# Patient Record
Sex: Female | Born: 1955 | Hispanic: No | Marital: Married | Smoking: Never smoker
Health system: Southern US, Community
[De-identification: ages and names within clinical notes are randomized; demographics above are authoritative.]

## PROBLEM LIST (undated history)

## (undated) DIAGNOSIS — G4733 Obstructive sleep apnea (adult) (pediatric): Secondary | ICD-10-CM

## (undated) DIAGNOSIS — E785 Hyperlipidemia, unspecified: Secondary | ICD-10-CM

## (undated) DIAGNOSIS — G629 Polyneuropathy, unspecified: Secondary | ICD-10-CM

## (undated) DIAGNOSIS — R42 Dizziness and giddiness: Secondary | ICD-10-CM

## (undated) DIAGNOSIS — G4700 Insomnia, unspecified: Secondary | ICD-10-CM

## (undated) DIAGNOSIS — F419 Anxiety disorder, unspecified: Secondary | ICD-10-CM

## (undated) DIAGNOSIS — Z8639 Personal history of other endocrine, nutritional and metabolic disease: Secondary | ICD-10-CM

## (undated) HISTORY — DX: Hyperlipidemia, unspecified: E78.5

## (undated) HISTORY — PX: APPENDECTOMY: SHX54

## (undated) HISTORY — DX: Obstructive sleep apnea (adult) (pediatric): G47.33

## (undated) HISTORY — DX: Dizziness and giddiness: R42

## (undated) HISTORY — DX: Insomnia, unspecified: G47.00

## (undated) HISTORY — DX: Personal history of other endocrine, nutritional and metabolic disease: Z86.39

## (undated) HISTORY — DX: Polyneuropathy, unspecified: G62.9

## (undated) HISTORY — DX: Anxiety disorder, unspecified: F41.9

---

## 1998-02-25 ENCOUNTER — Ambulatory Visit (HOSPITAL_COMMUNITY): Admission: RE | Admit: 1998-02-25 | Discharge: 1998-02-25 | Payer: Self-pay | Admitting: Neurology

## 1998-07-10 ENCOUNTER — Other Ambulatory Visit: Admission: RE | Admit: 1998-07-10 | Discharge: 1998-07-10 | Payer: Self-pay | Admitting: Gynecology

## 1999-09-09 ENCOUNTER — Encounter: Payer: Self-pay | Admitting: Family Medicine

## 1999-09-09 ENCOUNTER — Ambulatory Visit (HOSPITAL_COMMUNITY): Admission: RE | Admit: 1999-09-09 | Discharge: 1999-09-09 | Payer: Self-pay | Admitting: Family Medicine

## 1999-09-19 ENCOUNTER — Ambulatory Visit (HOSPITAL_COMMUNITY): Admission: RE | Admit: 1999-09-19 | Discharge: 1999-09-19 | Payer: Self-pay | Admitting: Family Medicine

## 1999-09-19 ENCOUNTER — Encounter: Payer: Self-pay | Admitting: Family Medicine

## 1999-09-24 ENCOUNTER — Other Ambulatory Visit: Admission: RE | Admit: 1999-09-24 | Discharge: 1999-09-24 | Payer: Self-pay | Admitting: Gynecology

## 1999-09-25 ENCOUNTER — Ambulatory Visit (HOSPITAL_COMMUNITY): Admission: RE | Admit: 1999-09-25 | Discharge: 1999-09-25 | Payer: Self-pay | Admitting: Gynecology

## 1999-09-25 ENCOUNTER — Encounter (INDEPENDENT_AMBULATORY_CARE_PROVIDER_SITE_OTHER): Payer: Self-pay | Admitting: Specialist

## 1999-10-28 ENCOUNTER — Encounter: Admission: RE | Admit: 1999-10-28 | Discharge: 1999-12-05 | Payer: Self-pay

## 2000-02-09 ENCOUNTER — Encounter: Payer: Self-pay | Admitting: Family Medicine

## 2000-02-09 ENCOUNTER — Encounter: Admission: RE | Admit: 2000-02-09 | Discharge: 2000-02-09 | Payer: Self-pay | Admitting: Family Medicine

## 2000-10-12 ENCOUNTER — Other Ambulatory Visit: Admission: RE | Admit: 2000-10-12 | Discharge: 2000-10-12 | Payer: Self-pay | Admitting: Gynecology

## 2001-02-10 ENCOUNTER — Encounter: Admission: RE | Admit: 2001-02-10 | Discharge: 2001-02-10 | Payer: Self-pay | Admitting: Gynecology

## 2001-02-10 ENCOUNTER — Encounter: Payer: Self-pay | Admitting: Gynecology

## 2002-02-13 ENCOUNTER — Encounter: Payer: Self-pay | Admitting: Gynecology

## 2002-02-13 ENCOUNTER — Encounter: Admission: RE | Admit: 2002-02-13 | Discharge: 2002-02-13 | Payer: Self-pay | Admitting: Gynecology

## 2002-07-31 ENCOUNTER — Other Ambulatory Visit: Admission: RE | Admit: 2002-07-31 | Discharge: 2002-07-31 | Payer: Self-pay | Admitting: Gynecology

## 2003-04-09 ENCOUNTER — Encounter: Payer: Self-pay | Admitting: Gynecology

## 2003-04-09 ENCOUNTER — Encounter: Admission: RE | Admit: 2003-04-09 | Discharge: 2003-04-09 | Payer: Self-pay | Admitting: Gynecology

## 2004-01-14 ENCOUNTER — Other Ambulatory Visit: Admission: RE | Admit: 2004-01-14 | Discharge: 2004-01-14 | Payer: Self-pay | Admitting: Gynecology

## 2004-06-06 ENCOUNTER — Encounter: Admission: RE | Admit: 2004-06-06 | Discharge: 2004-06-06 | Payer: Self-pay | Admitting: Gynecology

## 2005-02-04 ENCOUNTER — Encounter: Admission: RE | Admit: 2005-02-04 | Discharge: 2005-02-04 | Payer: Self-pay | Admitting: Gynecology

## 2005-02-10 ENCOUNTER — Other Ambulatory Visit: Admission: RE | Admit: 2005-02-10 | Discharge: 2005-02-10 | Payer: Self-pay | Admitting: Gynecology

## 2005-06-08 ENCOUNTER — Ambulatory Visit (HOSPITAL_BASED_OUTPATIENT_CLINIC_OR_DEPARTMENT_OTHER): Admission: RE | Admit: 2005-06-08 | Discharge: 2005-06-08 | Payer: Self-pay | Admitting: Gynecology

## 2005-06-08 ENCOUNTER — Encounter (INDEPENDENT_AMBULATORY_CARE_PROVIDER_SITE_OTHER): Payer: Self-pay | Admitting: Specialist

## 2005-06-08 ENCOUNTER — Ambulatory Visit (HOSPITAL_COMMUNITY): Admission: RE | Admit: 2005-06-08 | Discharge: 2005-06-08 | Payer: Self-pay | Admitting: Gynecology

## 2005-08-12 ENCOUNTER — Encounter: Admission: RE | Admit: 2005-08-12 | Discharge: 2005-08-12 | Payer: Self-pay | Admitting: Gynecology

## 2006-02-24 ENCOUNTER — Other Ambulatory Visit: Admission: RE | Admit: 2006-02-24 | Discharge: 2006-02-24 | Payer: Self-pay | Admitting: Gynecology

## 2006-10-12 ENCOUNTER — Encounter: Admission: RE | Admit: 2006-10-12 | Discharge: 2006-10-12 | Payer: Self-pay | Admitting: Gynecology

## 2008-02-06 ENCOUNTER — Encounter: Admission: RE | Admit: 2008-02-06 | Discharge: 2008-02-06 | Payer: Self-pay | Admitting: Gynecology

## 2009-02-06 ENCOUNTER — Encounter: Admission: RE | Admit: 2009-02-06 | Discharge: 2009-02-06 | Payer: Self-pay | Admitting: Family Medicine

## 2009-02-18 ENCOUNTER — Encounter: Admission: RE | Admit: 2009-02-18 | Discharge: 2009-02-18 | Payer: Self-pay | Admitting: Family Medicine

## 2009-02-20 ENCOUNTER — Encounter: Payer: Self-pay | Admitting: Emergency Medicine

## 2009-02-20 ENCOUNTER — Inpatient Hospital Stay (HOSPITAL_COMMUNITY): Admission: EM | Admit: 2009-02-20 | Discharge: 2009-02-20 | Payer: Self-pay | Admitting: General Surgery

## 2009-02-20 ENCOUNTER — Ambulatory Visit: Payer: Self-pay | Admitting: Diagnostic Radiology

## 2009-02-20 ENCOUNTER — Encounter (INDEPENDENT_AMBULATORY_CARE_PROVIDER_SITE_OTHER): Payer: Self-pay | Admitting: General Surgery

## 2010-02-07 ENCOUNTER — Encounter: Admission: RE | Admit: 2010-02-07 | Discharge: 2010-02-07 | Payer: Self-pay | Admitting: Gynecology

## 2010-10-25 LAB — DIFFERENTIAL
Eosinophils Relative: 0 % (ref 0–5)
Lymphocytes Relative: 11 % — ABNORMAL LOW (ref 12–46)
Lymphs Abs: 1.5 10*3/uL (ref 0.7–4.0)
Monocytes Absolute: 0.5 10*3/uL (ref 0.1–1.0)
Monocytes Relative: 4 % (ref 3–12)

## 2010-10-25 LAB — URINE CULTURE: Colony Count: 9000

## 2010-10-25 LAB — PREGNANCY, URINE: Preg Test, Ur: NEGATIVE

## 2010-10-25 LAB — URINALYSIS, ROUTINE W REFLEX MICROSCOPIC
Ketones, ur: >80 mg/dL — AB
Nitrite: NEGATIVE
Protein, ur: NEGATIVE mg/dL
pH: 6 (ref 5.0–8.0)

## 2010-10-25 LAB — CBC
MCHC: 35 g/dL (ref 30.0–36.0)
MCV: 94.1 fL (ref 78.0–100.0)
Platelets: 235 10*3/uL (ref 150–400)

## 2010-10-25 LAB — COMPREHENSIVE METABOLIC PANEL
ALT: 7 U/L (ref 0–35)
AST: 18 U/L (ref 0–37)
Albumin: 4.1 g/dL (ref 3.5–5.2)
Calcium: 9.2 mg/dL (ref 8.4–10.5)
Creatinine, Ser: 0.7 mg/dL (ref 0.4–1.2)
GFR calc Af Amer: >60 mL/min (ref >60)
Sodium: 142 mEq/L (ref 135–145)
Total Protein: 7.8 g/dL (ref 6.0–8.3)

## 2010-10-25 LAB — LIPASE, BLOOD: Lipase: 73 U/L (ref 23–300)

## 2010-10-25 LAB — URINE MICROSCOPIC-ADD ON

## 2010-12-02 NOTE — Op Note (Signed)
NAMEVONCEIL, UPSHUR NO.:  [id number]   MEDICAL RECORD NO.:  [id number]          PATIENT TYPE:  INP   LOCATION:  5014                         FACILITY:  [facility]   PHYSICIAN:  Gabrielle Dare. Janee Morn, M.D.DATE OF BIRTH:  [birth year]   DATE OF PROCEDURE:  02/20/2009  DATE OF DISCHARGE:                               OPERATIVE REPORT   PREOPERATIVE DIAGNOSIS:  Acute appendicitis.   POSTOPERATIVE DIAGNOSIS:  Acute appendicitis.   PROCEDURE:  Laparoscopic appendectomy.   SURGEON:  Gabrielle Dare. Janee Morn, MD   ANESTHESIA:  General endotracheal.   HISTORY OF PRESENT ILLNESS:  Ms. Hack is a 55 year old white female  who presented to the medical center of [facility] with a 2-  day history of abdominal pain that localized to her right lower  quadrant.  CT scan demonstrated an enlarged appendix consistent with  appendicitis.  She was accepted and transferred to [facility]  and brought emergently to the operating room for laparoscopic  appendectomy.   PROCEDURE IN DETAIL:  Informed consent was obtained.  The patient was  identified in the preop holding area.  She had received intravenous  antibiotics.  She was brought to the operating room and general  endotracheal anesthesia was administered by the anesthesia staff.  Her  abdomen was prepped and draped in the sterile fashion.  Time-out  procedure was done.  Infraumbilical area was infiltrated with 0.25%  Marcaine with epinephrine.  Infraumbilical incision was made.  Subcutaneous tissues were dissected down revealing the anterior fascia.  This was divided sharply and the peritoneal cavity was entered under  direct vision without difficulty.  A 0 Vicryl pursestring suture was  placed around the fascial opening.  The Hasson trocar was inserted into  the abdomen.  The abdomen was insufflated with carbon dioxide in  standard fashion.  Under direct vision, a 12-mm left lower quadrant and  a 5-mm right midabdomen  port were placed.  Marcaine 0.25% with  epinephrine was used at all port sites.  Laparoscopic exploration  revealed a very enlarged and inflamed appendix.  It was not perforated.  The mesoappendix was gradually divided with the harmonic scalpel,  achieving excellent hemostasis.  This revealed the base of the appendix  was quite wide, so I selected the reticulating laparoscopic GIA stapler  with a vascular load.  The appendix was divided at its base.  There was  excellent staple line that was intact.  The appendix was placed in an  EndoCatch bag and removed from the abdomen via the left lower quadrant  port site.  The abdomen was copiously irrigated with over 2 L of saline.  There was 1 point along the staple line that was bleeding.  This was  just touched with a cautery, achieving excellent hemostasis.  Further  irrigation revealed no further bleeding.  The irrigation fluid was  clear.  Staple line remained intact.  The remainder of the irrigation  fluid was evacuated.  The ports were then removed under direct vision.  The pneumoperitoneum was released.  The Hasson trocar was removed.  The  infraumbilical fascia was closed by tying the  0 Vicryl pursestring  suture with care not to trap any intra-abdominal contents.  All 3 wounds  were copiously irrigated.  The skin of each was closed with running 4-0  Vicryl subcuticular stitch followed by Dermabond.  All sponge, needle,  and instrument counts were correct.  The patient tolerated the procedure  well without apparent complication and was taken to recovery room in  stable condition.      Gabrielle Dare Janee Morn, M.D.  Electronically Signed     BET/MEDQ  D:  02/20/2009  T:  02/20/2009  Job:  [id number]   cc:   Molly Maduro L. Foy [place], M.D.

## 2010-12-02 NOTE — H&P (Signed)
Annette Moody, SENDEJO NO.:  [id number]   MEDICAL RECORD NO.:  [id number]          PATIENT TYPE:  INP   LOCATION:  5014                         FACILITY:  [facility]   PHYSICIAN:  Gabrielle Dare. Janee Morn, M.D.DATE OF BIRTH:  10-04-1955   DATE OF ADMISSION:  02/20/2009  DATE OF DISCHARGE:                              HISTORY & PHYSICAL   CHIEF COMPLAINT:  Right lower quadrant abdominal pain.   HISTORY OF PRESENT ILLNESS:  Annette Moody is a 55 year old white female  who is otherwise healthy, who developed generalized abdominal pain 2  days ago.  She had associated nausea and vomiting especially in the  evening after the pain developed.  The patient was seen by her primary  physician and thought to have food poisoning.  She was prescribed  Phenergan, the nausea improved somewhat, but the pain persisted.  It  gradually localized to her right lower quadrant.  The pain worsened and  she went to the [facility] early this morning  for evaluation.  There, she was found to have elevated white blood cell  count of 13,000 and CT scan of the abdomen and pelvis was done showing  acute appendicitis.  She was accepted and transferred to [facility].   PAST MEDICAL HISTORY:  Negative.   PAST SURGICAL HISTORY:  Tubal ligation.   CURRENT MEDICATIONS:  Phenergan p.r.n.   ALLERGIES:  No known drug allergies.   REVIEW OF SYSTEMS:  Positive for the GI complaints as above, otherwise  negative.   PHYSICAL EXAMINATION:  VITAL SIGNS:  Temperature 98.3, blood pressure  114/68, heart rate 70, respirations 16.  GENERAL:  She is awake and alert.  HEENT:  Pupils are equal.  Sclerae clear.  Oral mucosa is moist.  NECK:  Supple with no tenderness or masses.  LUNGS:  Clear to auscultation.  CARDIOVASCULAR:  Regular.  No murmurs.  Pulses palpable in the left  chest.  ABDOMEN:  Tender in the right lower quadrant with no guarding.  No  masses are felt.  Bowel sounds  are present.  SKIN:  Warm and dry.  EXTREMITIES:  No deformities.   LABORATORY STUDIES:  Show sodium 142, potassium 3.8, chloride 103, CO2  of 27, BUN 15, creatinine 0.7, glucose 121.  White blood cell count  13,000, hemoglobin 14, platelets 235.  Chest x-ray negative.  EKG:  Normal sinus rhythm.   IMPRESSION AND PLAN:  Acute appendicitis.  Plan would be to take the  patient to the operating room for emergency laparoscopic appendectomy.  She has received intravenous antibiotics.  Procedure, risks, and  benefits are discussed in detail with the patient.  She agrees.      Gabrielle Dare Janee Morn, M.D.  Electronically Signed     BET/MEDQ  D:  02/20/2009  T:  02/20/2009  Job:  [id number]

## 2010-12-05 NOTE — Op Note (Signed)
NAME:  Annette Moody, Annette Moody NO.:  [id number]   MEDICAL RECORD NO.:  [id number]          PATIENT TYPE:  AMB   LOCATION:  NESC                         FACILITY:  [facility]   PHYSICIAN:  Gretta Cool, M.D. DATE OF BIRTH:  [birth year]   DATE OF PROCEDURE:  06/08/2005  DATE OF DISCHARGE:                                 OPERATIVE REPORT   PREOPERATIVE DIAGNOSIS:  Abnormal uterine bleeding, unresponsive to  conservative therapy.   POSTOPERATIVE DIAGNOSIS:  Abnormal uterine bleeding, unresponsive to  conservative therapy.   PROCEDURE:  Hysteroscopy, resection of polypoid posterior uterine wall and  total endometrial resection for ablation.   SURGEON:  Gretta Cool, M.D.   ANESTHESIA:  MAC and paracervical block.   DESCRIPTION OF PROCEDURE:  Under excellent anesthesia as above, with the  patient prepped and draped in lithotomy position and in Clayton stirrups, and  her bladder drained, a weighted speculum was placed in the vagina.  The  cervix was then progressively dilated with a series of Pratt dilators to  accommodate the 7 mm resectoscope.  Once the resectoscope was introduced the  endometrial cavity was photographed.  The upper endometrium and tubal ostial  area looked entirely normal.  There was a polypoid character to the  posterior uterine wall that was exceedingly thickened.  The anterior uterine  wall appeared normal.  Once the tubal ostial areas were resected so as to  close the tubal ostium and prevent excessive fluid leak, the entire  endometrial cavity was resected beginning with the posterior uterine wall.  It was resected down through the myometrium, endometrium into the myometrial  tissue, approximately 3 to 5 mm.  At this point once all of the endometrial  tissue had been resected a VaporTrode electrode was applied and the entire  endometrial cavity treated by VaporTrode so as to eliminate any islands of  endometrial tissue not resected in the  superficial myometrium.  The  procedure was then terminated without complications.  There was no  significant bleeding at reduced pressure. The patient returned to the  recovery room in excellent condition.           ______________________________  Gretta Cool, M.D.     CWL/MEDQ  D:  06/08/2005  T:  06/08/2005  Job:  [id number]   cc:   Molly Maduro L. Foy [place], M.D.  Fax: [phone]

## 2011-02-09 ENCOUNTER — Other Ambulatory Visit: Payer: Self-pay | Admitting: Gynecology

## 2011-02-09 DIAGNOSIS — Z1231 Encounter for screening mammogram for malignant neoplasm of breast: Secondary | ICD-10-CM

## 2011-02-16 ENCOUNTER — Ambulatory Visit
Admission: RE | Admit: 2011-02-16 | Discharge: 2011-02-16 | Disposition: A | Discharge status: Home / Self Care | Payer: BC Managed Care – PPO | Source: Ambulatory Visit | Attending: Gynecology | Admitting: Gynecology

## 2011-02-16 DIAGNOSIS — Z1231 Encounter for screening mammogram for malignant neoplasm of breast: Secondary | ICD-10-CM

## 2011-07-22 ENCOUNTER — Other Ambulatory Visit: Payer: Self-pay | Admitting: Gynecology

## 2012-06-03 ENCOUNTER — Other Ambulatory Visit: Payer: Self-pay | Admitting: Gynecology

## 2012-06-03 DIAGNOSIS — Z1231 Encounter for screening mammogram for malignant neoplasm of breast: Secondary | ICD-10-CM

## 2012-07-04 ENCOUNTER — Ambulatory Visit: Payer: BC Managed Care – PPO

## 2012-08-11 ENCOUNTER — Ambulatory Visit: Payer: BC Managed Care – PPO

## 2012-08-12 ENCOUNTER — Ambulatory Visit
Admission: RE | Admit: 2012-08-12 | Discharge: 2012-08-12 | Disposition: A | Discharge status: Home / Self Care | Payer: BC Managed Care – PPO | Source: Ambulatory Visit | Attending: Gynecology | Admitting: Gynecology

## 2012-08-12 DIAGNOSIS — Z1231 Encounter for screening mammogram for malignant neoplasm of breast: Secondary | ICD-10-CM

## 2012-10-12 ENCOUNTER — Emergency Department (HOSPITAL_BASED_OUTPATIENT_CLINIC_OR_DEPARTMENT_OTHER): Payer: BC Managed Care – PPO

## 2012-10-12 ENCOUNTER — Emergency Department (HOSPITAL_BASED_OUTPATIENT_CLINIC_OR_DEPARTMENT_OTHER)
Admission: EM | Admit: 2012-10-12 | Discharge: 2012-10-12 | Disposition: A | Discharge status: Home / Self Care | Payer: BC Managed Care – PPO | Attending: Emergency Medicine | Admitting: Emergency Medicine

## 2012-10-12 ENCOUNTER — Encounter (HOSPITAL_BASED_OUTPATIENT_CLINIC_OR_DEPARTMENT_OTHER): Payer: Self-pay | Admitting: *Deleted

## 2012-10-12 DIAGNOSIS — Z79899 Other long term (current) drug therapy: Secondary | ICD-10-CM | POA: Insufficient documentation

## 2012-10-12 DIAGNOSIS — R209 Unspecified disturbances of skin sensation: Secondary | ICD-10-CM | POA: Insufficient documentation

## 2012-10-12 DIAGNOSIS — R51 Headache: Secondary | ICD-10-CM

## 2012-10-12 DIAGNOSIS — R202 Paresthesia of skin: Secondary | ICD-10-CM

## 2012-10-12 LAB — BASIC METABOLIC PANEL
BUN: 13 mg/dL (ref 6–23)
Chloride: 104 mEq/L (ref 96–112)
Creatinine, Ser: 0.6 mg/dL (ref 0.50–1.10)
GFR calc Af Amer: >90 mL/min (ref >90)

## 2012-10-12 LAB — CBC WITH DIFFERENTIAL/PLATELET
Basophils Relative: 0 % (ref 0–1)
Eosinophils Relative: 1 % (ref 0–5)
HCT: 42.9 % (ref 36.0–46.0)
Hemoglobin: 14.6 g/dL (ref 12.0–15.0)
MCH: 31.5 pg (ref 26.0–34.0)
MCHC: 34 g/dL (ref 30.0–36.0)
MCV: 92.5 fL (ref 78.0–100.0)
Monocytes Absolute: 0.4 10*3/uL (ref 0.1–1.0)
Monocytes Relative: 5 % (ref 3–12)
Neutro Abs: 4.5 10*3/uL (ref 1.7–7.7)

## 2012-10-12 MED ORDER — METOCLOPRAMIDE HCL 5 MG/ML IJ SOLN
10.0000 mg | Freq: Once | INTRAMUSCULAR | Status: AC
  Administered 2012-10-12: 10 mg via INTRAVENOUS
  Filled 2012-10-12: qty 2

## 2012-10-12 MED ORDER — KETOROLAC TROMETHAMINE 30 MG/ML IJ SOLN
30.0000 mg | Freq: Once | INTRAMUSCULAR | Status: AC
  Administered 2012-10-12: 30 mg via INTRAVENOUS
  Filled 2012-10-12: qty 1

## 2012-10-12 MED ORDER — DIPHENHYDRAMINE HCL 50 MG/ML IJ SOLN
25.0000 mg | Freq: Once | INTRAMUSCULAR | Status: AC
  Administered 2012-10-12: 14:00:00 via INTRAVENOUS
  Filled 2012-10-12: qty 1

## 2012-10-12 NOTE — ED Provider Notes (Addendum)
57 year old female had a vague left sided headache last night. Today, she continued to have a left-sided headache with associated numbness which involve her entire face and left side of her body. The numbness was mild but she may not describe a sense of "feeling weird" on the left side of her body. There is no motor dysfunction in her speech was normal and she had no difficulty walking. She did have her blood pressure checked as he says it was very high for her. She saw her PCP who sent her to the ED for further evaluation. She states that the headache and the numbness or improving and are almost gone. She still feels a little funny in left side of her body. On exam, her vital signs are normal including normal blood pressure. Fundi show no hemorrhage, exudate, papilledema. There no carotid bruits. There is no tenderness palpation over the temporal artery. Lungs are clear heart is regular rate and rhythm without murmur. There is normal motor strength of 5/5 in arms and legs. There is no pronator drift. There no sensory deficits and no extinction on double simultaneous stimulation. I do suspect that her symptoms might be from a migraine variant and she is given an empiric trial of a migraine cocktail and she is being sent for CT. She probably should have remainder of TIA-type workup as an outpatient although this is not clearly a TIA.   Date: 10/12/2012  Rate: 80  Rhythm: normal sinus rhythm  QRS Axis: normal  Intervals: normal  ST/T Wave abnormalities: nonspecific T wave changes  Conduction Disutrbances:none  Narrative Interpretation: Minor nonspecific T wave flattening.  when compared with ECG of 02/20/2009, no significant changes are seen.  Old EKG Reviewed: unchanged  Medical screening examination/treatment/procedure(s) were conducted as a shared visit with non-physician practitioner(s) and myself.  I personally evaluated the patient during the encounter   Dione Booze, MD 10/12/12 1407  Dione Booze, MD 10/12/12 [phone]

## 2012-10-12 NOTE — ED Notes (Signed)
Pt reports she had headache this morning that started yesterday- around 0900 she reports "feeling strange"- tingling on left side of body, "couldn't focus, couldn't think straight", arms "felt heavy" and ears were ringing - b/p taken at work during episode was 138/110- went to pcp and was sent here for further eval and possible ct scan- pt states she feels better but still has left side headache

## 2012-10-12 NOTE — ED Notes (Signed)
Patient transported to X-ray 

## 2012-10-12 NOTE — ED Provider Notes (Signed)
History     CSN: [id number]  Arrival date & time 10/12/12  1306   First MD Initiated Contact with Patient 10/12/12 1321      Chief Complaint  Patient presents with  . Headache    (Consider location/radiation/quality/duration/timing/severity/associated sxs/prior treatment) HPI Comments: Pt states that she developed a headache yesterday that resolved through out the night:pt states that she went to work and she she developed the headache again and she developed a left sided tingling and didn't feel right:pt states that she is still having the symptoms but they have gotten some a lot better but have not resolved:pt denies cp, sob, fever, vomiting, slurred speech, numbness or weakness:pt states that she felt like her glasses were dirty in her left eye  Patient is a 57 y.o. female presenting with headaches. The history is provided by the patient. No language interpreter was used.  Headache Location: left sided. Quality:  Unable to specify Radiates to:  Does not radiate Onset quality:  Gradual Timing:  Intermittent Progression:  Improving Relieved by:  Nothing Ineffective treatments:  Acetaminophen and NSAIDs Associated symptoms: tingling   Associated symptoms: no fever, no numbness, no syncope, no vomiting and no weakness     History reviewed. No pertinent past medical history.  Past Surgical History  Procedure Laterality Date  . Appendectomy      No family history on file.  History  Substance Use Topics  . Smoking status: Never Smoker   . Smokeless tobacco: Never Used  . Alcohol Use: 0.6 oz/week    1 Glasses of wine per week    OB History   Grav Para Term Preterm Abortions TAB SAB Ect Mult Living                  Review of Systems  Constitutional: Negative for fever.  Respiratory: Negative.   Cardiovascular: Negative.  Negative for syncope.  Gastrointestinal: Negative for vomiting.  Neurological: Positive for headaches. Negative for numbness.    Allergies   Review of patient's allergies indicates no known allergies.  Home Medications   Current Outpatient Rx  Name  Route  Sig  Dispense  Refill  . atorvastatin (LIPITOR) 20 MG tablet   Oral   Take 20 mg by mouth daily.         Marland Kitchen CALCIUM PO   Oral   Take 1 tablet by mouth daily.         Marland Kitchen estradiol (VIVELLE-DOT) 0.0375 MG/24HR   Transdermal   Place 1 patch onto the skin 2 (two) times a week.         . Multiple Vitamin (MULTIVITAMIN) capsule   Oral   Take 1 capsule by mouth daily.           BP 120/75  Pulse 90  Temp(Src) 98.1 F (36.7 C) (Oral)  Resp 18  Ht 5\' 2"  (1.575 m)  Wt 179 lb (81.194 kg)  BMI 32.73 kg/m2  SpO2 98%  Physical Exam  Nursing note and vitals reviewed. Constitutional: She is oriented to person, place, and time. She appears well-developed and well-nourished.  HENT:  Head: Normocephalic and atraumatic.  Right Ear: External ear normal.  Left Ear: External ear normal.  Eyes: Conjunctivae and EOM are normal. Pupils are equal, round, and reactive to light.  Neck: Normal range of motion. Neck supple.  Cardiovascular: Normal rate and regular rhythm.   Pulmonary/Chest: Effort normal and breath sounds normal.  Abdominal: Soft. Bowel sounds are normal. There is no tenderness.  Musculoskeletal:  Normal range of motion.  Neurological: She is alert and oriented to person, place, and time. Coordination normal.  Skin: Skin is warm and dry.  Psychiatric: She has a normal mood and affect.    ED Course  Procedures (including critical care time)  Labs Reviewed  BASIC METABOLIC PANEL - Abnormal; Notable for the following:    Glucose, Bld 147 (*)    All other components within normal limits  CBC WITH DIFFERENTIAL  TROPONIN I   Dg Chest 2 View  10/12/2012  *RADIOLOGY REPORT*  Clinical Data: Headache, weakness, tingling  CHEST - 2 VIEW  Comparison: 02/20/2009  Findings: Cardiomediastinal silhouette is stable.  No acute infiltrate or pleural effusion.  No  pulmonary edema.  Bony thorax is stable.  IMPRESSION: No active disease.  No significant change.   Original Report Authenticated By: Natasha Mead, M.D.    Ct Head Wo Contrast  10/12/2012  *RADIOLOGY REPORT*  Clinical Data:  Left sided headache.  CT HEAD WITHOUT CONTRAST  Technique:  Contiguous axial images were obtained from the base of the skull through the vertex without contrast  Comparison:  None.  Findings:  The brain has a normal appearance without evidence for hemorrhage, acute infarction, hydrocephalus, or mass lesion.  There is no extra axial fluid collection.  The skull and paranasal sinuses are normal.  IMPRESSION: Normal CT of the head without contrast.   Original Report Authenticated By: Janeece Riggers, M.D.      1. Headache   2. Tingling of left face and arm       MDM  Discussed tia vs complex migraine with pt:pt is asymptomatic at this time:negative workup today:think that pt is okay to follow up with pcp for out pt work up for ZOX:WRUEA with Gillermina Hu FNP at [facility] and she is okay with plan        Teressa Lower, NP 10/12/12 1529

## 2012-10-12 NOTE — ED Notes (Signed)
Patient transported to CT 

## 2012-10-14 ENCOUNTER — Other Ambulatory Visit: Payer: Self-pay | Admitting: Family Medicine

## 2012-10-14 DIAGNOSIS — R51 Headache: Secondary | ICD-10-CM

## 2012-10-18 ENCOUNTER — Other Ambulatory Visit: Payer: BC Managed Care – PPO

## 2012-10-24 ENCOUNTER — Ambulatory Visit
Admission: RE | Admit: 2012-10-24 | Discharge: 2012-10-24 | Disposition: A | Discharge status: Home / Self Care | Payer: BC Managed Care – PPO | Source: Ambulatory Visit | Attending: Family Medicine | Admitting: Family Medicine

## 2012-10-24 DIAGNOSIS — R51 Headache: Secondary | ICD-10-CM

## 2013-10-26 ENCOUNTER — Other Ambulatory Visit: Payer: Self-pay | Admitting: Family Medicine

## 2013-10-26 ENCOUNTER — Other Ambulatory Visit: Payer: Self-pay | Admitting: *Deleted

## 2013-10-26 DIAGNOSIS — E041 Nontoxic single thyroid nodule: Secondary | ICD-10-CM

## 2013-10-26 DIAGNOSIS — R002 Palpitations: Secondary | ICD-10-CM

## 2013-10-31 ENCOUNTER — Encounter: Payer: Self-pay | Admitting: Radiology

## 2013-10-31 ENCOUNTER — Encounter (INDEPENDENT_AMBULATORY_CARE_PROVIDER_SITE_OTHER): Payer: Self-pay

## 2013-10-31 ENCOUNTER — Encounter (INDEPENDENT_AMBULATORY_CARE_PROVIDER_SITE_OTHER): Payer: BC Managed Care – PPO

## 2013-10-31 DIAGNOSIS — R002 Palpitations: Secondary | ICD-10-CM

## 2013-10-31 NOTE — Progress Notes (Signed)
Patient ID: Baldemar LenisKertrainer H Urista, female   DOB: [birth year], 58 y.o.   MRN: [id number] E cardio 48 hr holter applied

## 2013-11-02 ENCOUNTER — Ambulatory Visit
Admission: RE | Admit: 2013-11-02 | Discharge: 2013-11-02 | Disposition: A | Discharge status: Home / Self Care | Payer: BC Managed Care – PPO | Source: Ambulatory Visit | Attending: Family Medicine | Admitting: Family Medicine

## 2013-11-02 DIAGNOSIS — E041 Nontoxic single thyroid nodule: Secondary | ICD-10-CM

## 2014-02-16 ENCOUNTER — Other Ambulatory Visit: Payer: Self-pay | Admitting: Endocrinology

## 2014-02-16 DIAGNOSIS — E049 Nontoxic goiter, unspecified: Secondary | ICD-10-CM

## 2014-05-02 ENCOUNTER — Other Ambulatory Visit: Payer: BC Managed Care – PPO

## 2014-05-03 ENCOUNTER — Ambulatory Visit
Admission: RE | Admit: 2014-05-03 | Discharge: 2014-05-03 | Disposition: A | Discharge status: Home / Self Care | Payer: BC Managed Care – PPO | Source: Ambulatory Visit | Attending: Endocrinology | Admitting: Endocrinology

## 2014-05-03 DIAGNOSIS — E049 Nontoxic goiter, unspecified: Secondary | ICD-10-CM

## 2014-05-25 ENCOUNTER — Other Ambulatory Visit: Payer: Self-pay | Admitting: Endocrinology

## 2014-05-25 DIAGNOSIS — E049 Nontoxic goiter, unspecified: Secondary | ICD-10-CM

## 2015-02-13 ENCOUNTER — Other Ambulatory Visit: Payer: Self-pay | Admitting: Obstetrics and Gynecology

## 2015-02-13 DIAGNOSIS — R928 Other abnormal and inconclusive findings on diagnostic imaging of breast: Secondary | ICD-10-CM

## 2015-02-15 ENCOUNTER — Ambulatory Visit
Admission: RE | Admit: 2015-02-15 | Discharge: 2015-02-15 | Disposition: A | Discharge status: Home / Self Care | Payer: BC Managed Care – PPO | Source: Ambulatory Visit | Attending: Obstetrics and Gynecology | Admitting: Obstetrics and Gynecology

## 2015-02-15 DIAGNOSIS — R928 Other abnormal and inconclusive findings on diagnostic imaging of breast: Secondary | ICD-10-CM

## 2015-05-21 ENCOUNTER — Ambulatory Visit
Admission: RE | Admit: 2015-05-21 | Discharge: 2015-05-21 | Disposition: A | Discharge status: Home / Self Care | Payer: BC Managed Care – PPO | Source: Ambulatory Visit | Attending: Endocrinology | Admitting: Endocrinology

## 2015-05-21 DIAGNOSIS — E049 Nontoxic goiter, unspecified: Secondary | ICD-10-CM

## 2017-05-19 ENCOUNTER — Other Ambulatory Visit: Payer: Self-pay | Admitting: Endocrinology

## 2017-05-19 DIAGNOSIS — E049 Nontoxic goiter, unspecified: Secondary | ICD-10-CM

## 2017-06-08 ENCOUNTER — Ambulatory Visit
Admission: RE | Admit: 2017-06-08 | Discharge: 2017-06-08 | Disposition: A | Discharge status: Home / Self Care | Payer: BC Managed Care – PPO | Source: Ambulatory Visit | Attending: Endocrinology | Admitting: Endocrinology

## 2017-06-08 DIAGNOSIS — E049 Nontoxic goiter, unspecified: Secondary | ICD-10-CM

## 2019-06-19 ENCOUNTER — Other Ambulatory Visit: Payer: Self-pay | Admitting: Endocrinology

## 2019-06-19 DIAGNOSIS — E049 Nontoxic goiter, unspecified: Secondary | ICD-10-CM

## 2019-06-26 ENCOUNTER — Ambulatory Visit
Admission: RE | Admit: 2019-06-26 | Discharge: 2019-06-26 | Disposition: A | Discharge status: Home / Self Care | Payer: BC Managed Care – PPO | Source: Ambulatory Visit | Attending: Endocrinology | Admitting: Endocrinology

## 2019-06-26 DIAGNOSIS — E049 Nontoxic goiter, unspecified: Secondary | ICD-10-CM

## 2019-11-13 ENCOUNTER — Other Ambulatory Visit: Payer: Self-pay | Admitting: Nurse Practitioner

## 2019-11-13 DIAGNOSIS — E2839 Other primary ovarian failure: Secondary | ICD-10-CM

## 2019-12-26 ENCOUNTER — Other Ambulatory Visit: Payer: Self-pay | Admitting: Nurse Practitioner

## 2019-12-26 DIAGNOSIS — N951 Menopausal and female climacteric states: Secondary | ICD-10-CM

## 2019-12-28 ENCOUNTER — Ambulatory Visit
Admission: RE | Admit: 2019-12-28 | Discharge: 2019-12-28 | Disposition: A | Discharge status: Home / Self Care | Payer: BC Managed Care – PPO | Source: Ambulatory Visit | Attending: Nurse Practitioner | Admitting: Nurse Practitioner

## 2019-12-28 ENCOUNTER — Encounter: Payer: Self-pay | Admitting: Radiology

## 2019-12-28 DIAGNOSIS — N951 Menopausal and female climacteric states: Secondary | ICD-10-CM

## 2021-02-19 ENCOUNTER — Other Ambulatory Visit: Payer: Self-pay | Admitting: *Deleted

## 2021-02-19 ENCOUNTER — Encounter: Payer: Self-pay | Admitting: *Deleted

## 2021-08-05 ENCOUNTER — Other Ambulatory Visit: Payer: Self-pay | Admitting: Gynecology

## 2021-08-05 DIAGNOSIS — R928 Other abnormal and inconclusive findings on diagnostic imaging of breast: Secondary | ICD-10-CM

## 2021-08-07 ENCOUNTER — Other Ambulatory Visit: Payer: Self-pay

## 2021-08-07 ENCOUNTER — Ambulatory Visit
Admission: RE | Admit: 2021-08-07 | Discharge: 2021-08-07 | Disposition: A | Discharge status: Home / Self Care | Payer: BC Managed Care – PPO | Source: Ambulatory Visit | Attending: Gynecology | Admitting: Gynecology

## 2021-08-07 ENCOUNTER — Other Ambulatory Visit: Payer: Self-pay | Admitting: Gynecology

## 2021-08-07 DIAGNOSIS — R928 Other abnormal and inconclusive findings on diagnostic imaging of breast: Secondary | ICD-10-CM

## 2021-08-07 HISTORY — PX: BREAST BIOPSY: SHX20

## 2022-01-02 ENCOUNTER — Other Ambulatory Visit: Payer: Self-pay | Admitting: Gynecology

## 2022-01-02 DIAGNOSIS — Z09 Encounter for follow-up examination after completed treatment for conditions other than malignant neoplasm: Secondary | ICD-10-CM

## 2022-02-05 ENCOUNTER — Ambulatory Visit
Admission: RE | Admit: 2022-02-05 | Discharge: 2022-02-05 | Disposition: A | Discharge status: Home / Self Care | Payer: BC Managed Care – PPO | Source: Ambulatory Visit | Attending: Gynecology | Admitting: Gynecology

## 2022-02-05 DIAGNOSIS — Z09 Encounter for follow-up examination after completed treatment for conditions other than malignant neoplasm: Secondary | ICD-10-CM

## 2022-03-03 ENCOUNTER — Other Ambulatory Visit: Payer: Self-pay | Admitting: General Surgery

## 2022-03-03 DIAGNOSIS — N6320 Unspecified lump in the left breast, unspecified quadrant: Secondary | ICD-10-CM

## 2022-03-04 ENCOUNTER — Other Ambulatory Visit: Payer: Self-pay | Admitting: General Surgery

## 2022-03-04 DIAGNOSIS — N6320 Unspecified lump in the left breast, unspecified quadrant: Secondary | ICD-10-CM

## 2022-03-27 ENCOUNTER — Encounter (HOSPITAL_BASED_OUTPATIENT_CLINIC_OR_DEPARTMENT_OTHER): Payer: Self-pay | Admitting: General Surgery

## 2022-04-02 MED ORDER — ENSURE PRE-SURGERY PO LIQD: 296.0000 mL | Freq: Once | ORAL | Status: DC

## 2022-04-02 NOTE — Progress Notes (Signed)

## 2022-04-03 ENCOUNTER — Other Ambulatory Visit: Payer: Self-pay | Admitting: General Surgery

## 2022-04-03 ENCOUNTER — Ambulatory Visit
Admission: RE | Admit: 2022-04-03 | Discharge: 2022-04-03 | Disposition: A | Discharge status: Home / Self Care | Payer: BC Managed Care – PPO | Source: Ambulatory Visit | Attending: General Surgery | Admitting: General Surgery

## 2022-04-03 DIAGNOSIS — N6320 Unspecified lump in the left breast, unspecified quadrant: Secondary | ICD-10-CM

## 2022-04-06 ENCOUNTER — Ambulatory Visit
Admission: RE | Admit: 2022-04-06 | Discharge: 2022-04-06 | Disposition: A | Discharge status: Home / Self Care | Payer: BC Managed Care – PPO | Source: Ambulatory Visit | Attending: General Surgery | Admitting: General Surgery

## 2022-04-06 ENCOUNTER — Ambulatory Visit (HOSPITAL_BASED_OUTPATIENT_CLINIC_OR_DEPARTMENT_OTHER)
Admission: RE | Admit: 2022-04-06 | Discharge: 2022-04-06 | Disposition: A | Discharge status: Home / Self Care | Payer: BC Managed Care – PPO | Attending: General Surgery | Admitting: General Surgery

## 2022-04-06 ENCOUNTER — Ambulatory Visit (HOSPITAL_BASED_OUTPATIENT_CLINIC_OR_DEPARTMENT_OTHER): Payer: BC Managed Care – PPO | Admitting: Anesthesiology

## 2022-04-06 ENCOUNTER — Other Ambulatory Visit: Payer: Self-pay

## 2022-04-06 ENCOUNTER — Encounter (HOSPITAL_BASED_OUTPATIENT_CLINIC_OR_DEPARTMENT_OTHER)
Admission: RE | Disposition: A | Discharge status: Home / Self Care | Payer: Self-pay | Source: Home / Self Care | Attending: General Surgery

## 2022-04-06 ENCOUNTER — Encounter (HOSPITAL_BASED_OUTPATIENT_CLINIC_OR_DEPARTMENT_OTHER): Payer: Self-pay | Admitting: General Surgery

## 2022-04-06 DIAGNOSIS — M329 Systemic lupus erythematosus, unspecified: Secondary | ICD-10-CM | POA: Diagnosis not present

## 2022-04-06 DIAGNOSIS — N6320 Unspecified lump in the left breast, unspecified quadrant: Secondary | ICD-10-CM

## 2022-04-06 DIAGNOSIS — Z6836 Body mass index (BMI) 36.0-36.9, adult: Secondary | ICD-10-CM | POA: Diagnosis not present

## 2022-04-06 DIAGNOSIS — F419 Anxiety disorder, unspecified: Secondary | ICD-10-CM | POA: Diagnosis not present

## 2022-04-06 DIAGNOSIS — E669 Obesity, unspecified: Secondary | ICD-10-CM | POA: Diagnosis not present

## 2022-04-06 DIAGNOSIS — Z01818 Encounter for other preprocedural examination: Secondary | ICD-10-CM

## 2022-04-06 DIAGNOSIS — N6032 Fibrosclerosis of left breast: Secondary | ICD-10-CM | POA: Insufficient documentation

## 2022-04-06 DIAGNOSIS — G4730 Sleep apnea, unspecified: Secondary | ICD-10-CM | POA: Diagnosis not present

## 2022-04-06 HISTORY — PX: BREAST EXCISIONAL BIOPSY: SUR124

## 2022-04-06 HISTORY — PX: RADIOACTIVE SEED GUIDED EXCISIONAL BREAST BIOPSY: SHX6490

## 2022-04-06 SURGERY — RADIOACTIVE SEED GUIDED BREAST BIOPSY
Anesthesia: General | Site: Breast | Laterality: Left

## 2022-04-06 MED ORDER — FENTANYL CITRATE (PF) 100 MCG/2ML IJ SOLN: 25.0000 ug | INTRAMUSCULAR | Status: DC | PRN

## 2022-04-06 MED ORDER — DEXAMETHASONE SODIUM PHOSPHATE 10 MG/ML IJ SOLN
INTRAMUSCULAR | Status: AC
  Filled 2022-04-06: qty 1

## 2022-04-06 MED ORDER — ONDANSETRON HCL 4 MG/2ML IJ SOLN
INTRAMUSCULAR | Status: AC
  Filled 2022-04-06: qty 2

## 2022-04-06 MED ORDER — CHLORHEXIDINE GLUCONATE CLOTH 2 % EX PADS: 6.0000 | MEDICATED_PAD | Freq: Once | CUTANEOUS | Status: DC

## 2022-04-06 MED ORDER — CEFAZOLIN SODIUM-DEXTROSE 2-4 GM/100ML-% IV SOLN
2.0000 g | INTRAVENOUS | Status: AC
  Administered 2022-04-06: 2 g via INTRAVENOUS

## 2022-04-06 MED ORDER — LIDOCAINE 2% (20 MG/ML) 5 ML SYRINGE
INTRAMUSCULAR | Status: AC
  Filled 2022-04-06: qty 5

## 2022-04-06 MED ORDER — MIDAZOLAM HCL 2 MG/2ML IJ SOLN
INTRAMUSCULAR | Status: AC
  Filled 2022-04-06: qty 2

## 2022-04-06 MED ORDER — LACTATED RINGERS IV SOLN: INTRAVENOUS | Status: DC

## 2022-04-06 MED ORDER — DEXAMETHASONE SODIUM PHOSPHATE 4 MG/ML IJ SOLN
INTRAMUSCULAR | Status: DC | PRN
  Administered 2022-04-06: 5 mg via INTRAVENOUS

## 2022-04-06 MED ORDER — PROPOFOL 10 MG/ML IV BOLUS
INTRAVENOUS | Status: DC | PRN
  Administered 2022-04-06: 150 mg via INTRAVENOUS

## 2022-04-06 MED ORDER — ONDANSETRON HCL 4 MG/2ML IJ SOLN: 4.0000 mg | Freq: Once | INTRAMUSCULAR | Status: DC | PRN

## 2022-04-06 MED ORDER — FENTANYL CITRATE (PF) 100 MCG/2ML IJ SOLN
INTRAMUSCULAR | Status: AC
  Filled 2022-04-06: qty 2

## 2022-04-06 MED ORDER — KETOROLAC TROMETHAMINE 15 MG/ML IJ SOLN: 15.0000 mg | Freq: Once | INTRAMUSCULAR | Status: DC | PRN

## 2022-04-06 MED ORDER — LIDOCAINE HCL (CARDIAC) PF 100 MG/5ML IV SOSY
PREFILLED_SYRINGE | INTRAVENOUS | Status: DC | PRN
  Administered 2022-04-06: 60 mg via INTRAVENOUS

## 2022-04-06 MED ORDER — ACETAMINOPHEN 500 MG PO TABS
1000.0000 mg | ORAL_TABLET | ORAL | Status: AC
  Administered 2022-04-06: 1000 mg via ORAL

## 2022-04-06 MED ORDER — MIDAZOLAM HCL 5 MG/5ML IJ SOLN
INTRAMUSCULAR | Status: DC | PRN
  Administered 2022-04-06: 2 mg via INTRAVENOUS

## 2022-04-06 MED ORDER — PROPOFOL 500 MG/50ML IV EMUL
INTRAVENOUS | Status: DC | PRN
  Administered 2022-04-06: 25 ug/kg/min via INTRAVENOUS

## 2022-04-06 MED ORDER — ACETAMINOPHEN 500 MG PO TABS
ORAL_TABLET | ORAL | Status: AC
  Filled 2022-04-06: qty 2

## 2022-04-06 MED ORDER — ONDANSETRON HCL 4 MG/2ML IJ SOLN
INTRAMUSCULAR | Status: DC | PRN
  Administered 2022-04-06: 4 mg via INTRAVENOUS

## 2022-04-06 MED ORDER — BUPIVACAINE HCL (PF) 0.25 % IJ SOLN
INTRAMUSCULAR | Status: DC | PRN
  Administered 2022-04-06: 10 mL

## 2022-04-06 MED ORDER — 0.9 % SODIUM CHLORIDE (POUR BTL) OPTIME
TOPICAL | Status: DC | PRN
  Administered 2022-04-06: 100 mL

## 2022-04-06 MED ORDER — CEFAZOLIN SODIUM-DEXTROSE 2-4 GM/100ML-% IV SOLN
INTRAVENOUS | Status: AC
  Filled 2022-04-06: qty 100

## 2022-04-06 MED ORDER — EPHEDRINE SULFATE (PRESSORS) 50 MG/ML IJ SOLN
INTRAMUSCULAR | Status: DC | PRN
  Administered 2022-04-06: 10 mg via INTRAVENOUS

## 2022-04-06 MED ORDER — AMISULPRIDE (ANTIEMETIC) 5 MG/2ML IV SOLN: 10.0000 mg | Freq: Once | INTRAVENOUS | Status: DC | PRN

## 2022-04-06 MED ORDER — FENTANYL CITRATE (PF) 100 MCG/2ML IJ SOLN
INTRAMUSCULAR | Status: DC | PRN
  Administered 2022-04-06: 100 ug via INTRAVENOUS

## 2022-04-06 SURGICAL SUPPLY — 54 items
ADH SKN CLS APL DERMABOND .7 (GAUZE/BANDAGES/DRESSINGS) ×1
APL PRP STRL LF DISP 70% ISPRP (MISCELLANEOUS) ×1
APPLIER CLIP 9.375 MED OPEN (MISCELLANEOUS)
APR CLP MED 9.3 20 MLT OPN (MISCELLANEOUS)
BINDER BREAST LRG (GAUZE/BANDAGES/DRESSINGS) IMPLANT
BINDER BREAST MEDIUM (GAUZE/BANDAGES/DRESSINGS) IMPLANT
BINDER BREAST XLRG (GAUZE/BANDAGES/DRESSINGS) IMPLANT
BINDER BREAST XXLRG (GAUZE/BANDAGES/DRESSINGS) IMPLANT
BLADE SURG 15 STRL LF DISP TIS (BLADE) ×1 IMPLANT
BLADE SURG 15 STRL SS (BLADE) ×1
CANISTER SUC SOCK COL 7IN (MISCELLANEOUS) IMPLANT
CANISTER SUCT 1200ML W/VALVE (MISCELLANEOUS) IMPLANT
CHLORAPREP W/TINT 26 (MISCELLANEOUS) ×1 IMPLANT
CLIP APPLIE 9.375 MED OPEN (MISCELLANEOUS) IMPLANT
CLIP TI WIDE RED SMALL 6 (CLIP) IMPLANT
COVER BACK TABLE 60X90IN (DRAPES) ×1 IMPLANT
COVER MAYO STAND STRL (DRAPES) ×1 IMPLANT
COVER PROBE W GEL 5X96 (DRAPES) ×1 IMPLANT
DERMABOND ADVANCED .7 DNX12 (GAUZE/BANDAGES/DRESSINGS) ×1 IMPLANT
DRAPE LAPAROSCOPIC ABDOMINAL (DRAPES) ×1 IMPLANT
DRAPE UTILITY XL STRL (DRAPES) ×1 IMPLANT
DRSG TEGADERM 4X4.75 (GAUZE/BANDAGES/DRESSINGS) IMPLANT
ELECT COATED BLADE 2.86 ST (ELECTRODE) ×1 IMPLANT
ELECT REM PT RETURN 9FT ADLT (ELECTROSURGICAL) ×1
ELECTRODE REM PT RTRN 9FT ADLT (ELECTROSURGICAL) ×1 IMPLANT
GAUZE SPONGE 4X4 12PLY STRL LF (GAUZE/BANDAGES/DRESSINGS) IMPLANT
GLOVE BIO SURGEON STRL SZ7 (GLOVE) ×2 IMPLANT
GLOVE BIOGEL PI IND STRL 7.5 (GLOVE) ×1 IMPLANT
GOWN STRL REUS W/ TWL LRG LVL3 (GOWN DISPOSABLE) ×2 IMPLANT
GOWN STRL REUS W/TWL LRG LVL3 (GOWN DISPOSABLE) ×2
HEMOSTAT ARISTA ABSORB 3G PWDR (HEMOSTASIS) IMPLANT
KIT MARKER MARGIN INK (KITS) ×1 IMPLANT
NDL HYPO 25X1 1.5 SAFETY (NEEDLE) ×1 IMPLANT
NEEDLE HYPO 25X1 1.5 SAFETY (NEEDLE) ×1 IMPLANT
NS IRRIG 1000ML POUR BTL (IV SOLUTION) IMPLANT
PACK BASIN DAY SURGERY FS (CUSTOM PROCEDURE TRAY) ×1 IMPLANT
PENCIL SMOKE EVACUATOR (MISCELLANEOUS) ×1 IMPLANT
RETRACTOR ONETRAX LX 90X20 (MISCELLANEOUS) IMPLANT
SLEEVE SCD COMPRESS KNEE MED (STOCKING) ×1 IMPLANT
SPIKE FLUID TRANSFER (MISCELLANEOUS) IMPLANT
SPONGE T-LAP 4X18 ~~LOC~~+RFID (SPONGE) ×1 IMPLANT
STRIP CLOSURE SKIN 1/2X4 (GAUZE/BANDAGES/DRESSINGS) ×1 IMPLANT
SUT MNCRL AB 4-0 PS2 18 (SUTURE) IMPLANT
SUT MON AB 5-0 PS2 18 (SUTURE) IMPLANT
SUT SILK 2 0 SH (SUTURE) IMPLANT
SUT VIC AB 2-0 SH 27 (SUTURE) ×1
SUT VIC AB 2-0 SH 27XBRD (SUTURE) ×1 IMPLANT
SUT VIC AB 3-0 SH 27 (SUTURE) ×1
SUT VIC AB 3-0 SH 27X BRD (SUTURE) ×1 IMPLANT
SYR CONTROL 10ML LL (SYRINGE) ×1 IMPLANT
TOWEL GREEN STERILE FF (TOWEL DISPOSABLE) ×1 IMPLANT
TRAY FAXITRON CT DISP (TRAY / TRAY PROCEDURE) ×1 IMPLANT
TUBE CONNECTING 20X1/4 (TUBING) IMPLANT
YANKAUER SUCT BULB TIP NO VENT (SUCTIONS) IMPLANT

## 2022-04-06 NOTE — Op Note (Signed)
Preoperative diagnosis:Left breast mass Postoperative diagnosis: Same as above Procedure  Left breast seed guided excisional biopsy Surgeon: Dr. Serita Grammes Anesthesia: General  Estimated blood loss: minimal Specimens: Left breast tissue containing seed and clip marked with paint Complications: None Drains: None Special count was correct completion Disposition to recovery in stable condition  Indications: 66 year old female who is otherwise healthy except for recently diagnosed lupus who presents after she initially underwent a screening mammogram that showed a left breast mass. She has B density breast. The mass was identified in January and biopsied and showed dense fibrosis and benign glands of the left breast. This was found to be concordant at that time. She underwent a 77-month follow-up. This showed a persistent mass measuring 7 mm on mammography. Ultrasound showed a similar mass.  She ends up having this lesion be read as discordant when she was reevaluated and recommended for excision. This mass is estimated to be 1.5 x 0.5 x 0.8 cm now. We discussed excisional biopsy.   Procedure: After informed consent was obtained the patient was taken to the OR.  She was given antibiotics.  SCDs were in place.  She was placed under general anesthesia without complication.  She was prepped and draped in the standard sterile surgical fashion.  Surgical timeout was then performed.   I identified the seed in the central  breast.  I made a periareolar incision in order to hide the scar later.  I then dissected to the seed. I removed the seed and some of surrounding tissue. Mammogram confirmed removal of the seed and the clip.   Hemostasis was observed. I closed the breast tissue with 2-0 vicryl.I then closed skin with 3-0 vicryl and 5-0 monocryl. Glue was placed.   she tolerated well, was extubated and transferred to recovery stable

## 2022-04-06 NOTE — Anesthesia Procedure Notes (Signed)
Procedure Name: LMA Insertion Date/Time: 04/06/2022 11:20 AM  Performed by: Tawni Millers, CRNAPre-anesthesia Checklist: Patient identified, Emergency Drugs available, Suction available and Patient being monitored Patient Re-evaluated:Patient Re-evaluated prior to induction Oxygen Delivery Method: Circle system utilized Preoxygenation: Pre-oxygenation with 100% oxygen Induction Type: IV induction Ventilation: Mask ventilation without difficulty LMA: LMA inserted LMA Size: 4.0 Number of attempts: 1 Airway Equipment and Method: Bite block Placement Confirmation: positive ETCO2 Tube secured with: Tape Dental Injury: Teeth and Oropharynx as per pre-operative assessment

## 2022-04-06 NOTE — Discharge Instructions (Addendum)
Annette Moody Office Phone Number [phone]  POST OP INSTRUCTIONS Take 400 mg of ibuprofen every 8 hours or 650 mg tylenol every 6 hours for next 72 hours then as needed. Use ice several times daily also.   Next dose of Tylenol my be given at 4:00PM, next dose of Ibuprofen may be given at any time.   A prescription for pain medication may be given to you upon discharge.  Take your pain medication as prescribed, if needed.  If narcotic pain medicine is not needed, then you may take acetaminophen (Tylenol), naprosyn (Alleve) or ibuprofen (Advil) as needed. Take your usually prescribed medications unless otherwise directed If you need a refill on your pain medication, please contact your pharmacy.  They will contact our office to request authorization.  Prescriptions will not be filled after 5pm or on week-ends. You should eat very light the first 24 hours after surgery, such as soup, crackers, pudding, etc.  Resume your normal diet the day after surgery. Most patients will experience some swelling and bruising in the breast.  Ice packs and a good support bra will help.  Wear the breast binder provided or a sports bra for 72 hours day and night.  After that wear a sports bra during the day until you return to the office. Swelling and bruising can take several days to resolve.  It is common to experience some constipation if taking pain medication after surgery.  Increasing fluid intake and taking a stool softener will usually help or prevent this problem from occurring.  A mild laxative (Milk of Magnesia or Miralax) should be taken according to package directions if there are no bowel movements after 48 hours. I used skin glue on the incision, you may shower in 24 hours.  The glue will flake off over the next 2-3 weeks.  Any sutures or staples will be removed at the office during your follow-up visit. ACTIVITIES:  You may resume regular daily activities (gradually increasing) beginning  the next day.  Wearing a good support bra or sports bra minimizes pain and swelling.  You may have sexual intercourse when it is comfortable. You may drive when you no longer are taking prescription pain medication, you can comfortably wear a seatbelt, and you can safely maneuver your car and apply brakes. RETURN TO WORK:  ______________________________________________________________________________________ Annette Moody should see your doctor in the office for a follow-up appointment approximately two weeks after your surgery.  Your doctor's nurse will typically make your follow-up appointment when she calls you with your pathology report.  Expect your pathology report 3-4 business days after your surgery.  You may call to check if you do not hear from [place] after three days. OTHER INSTRUCTIONS: _______________________________________________________________________________________________ _____________________________________________________________________________________________________________________________________ _____________________________________________________________________________________________________________________________________ _____________________________________________________________________________________________________________________________________  WHEN TO CALL [facility]: Fever over 101.0 Nausea and/or vomiting. Extreme swelling or bruising. Continued bleeding from incision. Increased pain, redness, or drainage from the incision.  The clinic staff is available to answer your questions during regular business hours.  Please don't hesitate to call and ask to speak to one of the nurses for clinical concerns.  If you have a medical emergency, go to the nearest emergency room or call 911.  A surgeon from [facility] is always on call at the hospital.  For further questions, please visit [email or web address]      Post Anesthesia Home Care  Instructions  Activity: Get plenty of rest for the remainder of the day. A responsible individual must stay with you for 24 hours  following the procedure.  For the next 24 hours, DO NOT: -Drive a car -Paediatric nurse -Drink alcoholic beverages -Take any medication unless instructed by your physician -Make any legal decisions or sign important papers.  Meals: Start with liquid foods such as gelatin or soup. Progress to regular foods as tolerated. Avoid greasy, spicy, heavy foods. If nausea and/or vomiting occur, drink only clear liquids until the nausea and/or vomiting subsides. Call your physician if vomiting continues.  Special Instructions/Symptoms: Your throat may feel dry or sore from the anesthesia or the breathing tube placed in your throat during surgery. If this causes discomfort, gargle with warm salt water. The discomfort should disappear within 24 hours.

## 2022-04-06 NOTE — Interval H&P Note (Signed)
History and Physical Interval Note:  04/06/2022 10:39 AM  Annette Moody  has presented today for surgery, with the diagnosis of LEFT BREAST MASS.  The various methods of treatment have been discussed with the patient and family. After consideration of risks, benefits and other options for treatment, the patient has consented to  Procedure(s): RADIOACTIVE SEED GUIDED EXCISIONAL LEFT BREAST BIOPSY (Left) as a surgical intervention.  The patient's history has been reviewed, patient examined, no change in status, stable for surgery.  I have reviewed the patient's chart and labs.  Questions were answered to the patient's satisfaction.     Rolm Bookbinder

## 2022-04-06 NOTE — Transfer of Care (Signed)
Immediate Anesthesia Transfer of Care Note  Patient: Annette Moody  Procedure(s) Performed: RADIOACTIVE SEED GUIDED EXCISIONAL LEFT BREAST BIOPSY (Left: Breast)  Patient Location: PACU  Anesthesia Type:General  Level of Consciousness: awake  Airway & Oxygen Therapy: Patient Spontanous Breathing and Patient connected to face mask oxygen  Post-op Assessment: Report given to RN and Post -op Vital signs reviewed and stable  Post vital signs: Reviewed and stable  Last Vitals:  Vitals Value Taken Time  BP 97/64 04/06/22 1143  Temp    Pulse 62 04/06/22 1145  Resp 16 04/06/22 1145  SpO2 99 % 04/06/22 1145  Vitals shown include unvalidated device data.  Last Pain:  Vitals:   04/06/22 0952  TempSrc: Oral  PainSc: 0-No pain         Complications: No notable events documented.

## 2022-04-06 NOTE — Anesthesia Preprocedure Evaluation (Addendum)
Anesthesia Evaluation  Patient identified by MRN, date of birth, ID band Patient awake    Reviewed: Allergy & Precautions, NPO status , Patient's Chart, lab work & pertinent test results  Airway Mallampati: III  TM Distance: >3 FB Neck ROM: Full    Dental no notable dental hx.    Pulmonary sleep apnea and Continuous Positive Airway Pressure Ventilation ,    Pulmonary exam normal        Cardiovascular negative cardio ROS Normal cardiovascular exam     Neuro/Psych PSYCHIATRIC DISORDERS Anxiety negative neurological ROS     GI/Hepatic negative GI ROS, Neg liver ROS,   Endo/Other  negative endocrine ROS  Renal/GU negative Renal ROS     Musculoskeletal negative musculoskeletal ROS (+)   Abdominal (+) + obese,   Peds  Hematology negative hematology ROS (+)   Anesthesia Other Findings LEFT BREAST MASS  Reproductive/Obstetrics                           Anesthesia Physical Anesthesia Plan  ASA: 2  Anesthesia Plan: General   Post-op Pain Management:    Induction: Intravenous  PONV Risk Score and Plan: 3 and Ondansetron, Dexamethasone, Midazolam and Treatment may vary due to age or medical condition  Airway Management Planned: LMA  Additional Equipment:   Intra-op Plan:   Post-operative Plan: Extubation in OR  Informed Consent: I have reviewed the patients History and Physical, chart, labs and discussed the procedure including the risks, benefits and alternatives for the proposed anesthesia with the patient or authorized representative who has indicated his/her understanding and acceptance.     Dental advisory given  Plan Discussed with: CRNA  Anesthesia Plan Comments:        Anesthesia Quick Evaluation

## 2022-04-06 NOTE — H&P (Signed)
  66 year old female who is otherwise healthy except for recently diagnosed lupus who presents after she initially underwent a screening mammogram that showed a left breast mass. She has B density breast. The mass was identified in January and biopsied and showed dense fibrosis and benign glands of the left breast. This was found to be concordant at that time. She underwent a 20-month follow-up. This showed a persistent mass measuring 7 mm on mammography. Ultrasound showed a similar mass. Her lymph nodes are negative. She ends up having this lesion be read as discordant when she was reevaluated and recommended for excision. There are no other real abnormalities that she needs taking care of now. This mass is estimated to be 1.5 x 0.5 x 0.8 cm and her density was listed as see on the latest mammogram. She is here to discuss her options today.  Review of Systems: A complete review of systems was obtained from the patient. I have reviewed this information and discussed as appropriate with the patient. See HPI as well for other ROS.  Review of Systems  All other systems reviewed and are negative.   Medical History: Past Medical History:  Diagnosis Date  Hyperlipidemia  Sleep apnea   Patient Active Problem List  Diagnosis  Systemic lupus erythematosus (CMS-HCC)   Past Surgical History:  Procedure Laterality Date  APPENDECTOMY    No Known Allergies  Current Outpatient Medications on File Prior to Visit  Medication Sig Dispense Refill  CRESTOR 10 mg tablet 1 tablet  meloxicam (MOBIC) 15 MG tablet 1 tablet  multivitamin capsule Take 1 capsule by mouth once daily    Family History  Problem Relation Age of Onset  Breast cancer Sister    Social History   Tobacco Use  Smoking Status Never  Smokeless Tobacco Never  Marital status: Married  Tobacco Use  Smoking status: Never  Smokeless tobacco: Never  Substance and Sexual Activity  Alcohol use: Not Currently  Drug use: Not Currently    Objective:   Vitals:   Physical Exam Vitals reviewed.  Constitutional:  Appearance: Normal appearance.  Chest:  Breasts: Right: No inverted nipple, mass or nipple discharge.  Left: No inverted nipple, mass or nipple discharge.  Lymphadenopathy:  Upper Body:  Right upper body: No supraclavicular or axillary adenopathy.  Left upper body: No supraclavicular or axillary adenopathy.  Neurological:  Mental Status: She is alert.    Assessment and Plan:   Mass of upper inner quadrant of left breast  Left breast seed guided excisional biopsy  We discussed the indications being discordant as per the radiology. We discussed removing this to ensure there is nothing else associated with the core biopsy. We discussed the procedure, risk, and recovery. We will proceed.

## 2022-04-06 NOTE — Anesthesia Postprocedure Evaluation (Signed)
Anesthesia Post Note  Patient: Annette Moody  Procedure(s) Performed: RADIOACTIVE SEED GUIDED EXCISIONAL LEFT BREAST BIOPSY (Left: Breast)     Patient location during evaluation: PACU Anesthesia Type: General Level of consciousness: awake and alert, oriented and patient cooperative Pain management: pain level controlled Vital Signs Assessment: post-procedure vital signs reviewed and stable Respiratory status: spontaneous breathing, nonlabored ventilation and respiratory function stable Cardiovascular status: blood pressure returned to baseline and stable Postop Assessment: no apparent nausea or vomiting Anesthetic complications: no   No notable events documented.  Last Vitals:  Vitals:   04/06/22 1200 04/06/22 1202  BP: 98/69   Pulse: 69 71  Resp: 16 13  Temp:    SpO2: 95% 96%    Last Pain:  Vitals:   04/06/22 1202  TempSrc:   PainSc: 0-No pain                 Pervis Hocking

## 2022-04-07 ENCOUNTER — Encounter (HOSPITAL_BASED_OUTPATIENT_CLINIC_OR_DEPARTMENT_OTHER): Payer: Self-pay | Admitting: General Surgery

## 2022-04-08 LAB — SURGICAL PATHOLOGY

## 2022-04-24 ENCOUNTER — Encounter: Payer: Self-pay | Admitting: Neurology

## 2022-04-28 ENCOUNTER — Encounter (HOSPITAL_COMMUNITY): Payer: Self-pay

## 2022-05-20 IMAGING — US US BREAST BX W LOC DEV 1ST LESION IMG BX SPEC US GUIDE*L*
1 series · 9 of 9 positions shown · non-contrast
Comparison: Previous exam(s).
COMPARISON: Previous exam(s).

Addendum:
CLINICAL DATA: 65-year-old female with adjacent indeterminate left
breast masses.

EXAM:
ULTRASOUND GUIDED LEFT BREAST CORE NEEDLE BIOPSY

[Series 1: us breast bx w loc dev 1st lesion img bx spec us g · 0.07mm/px · 9 of 9 slices shown]
[im 1/9]
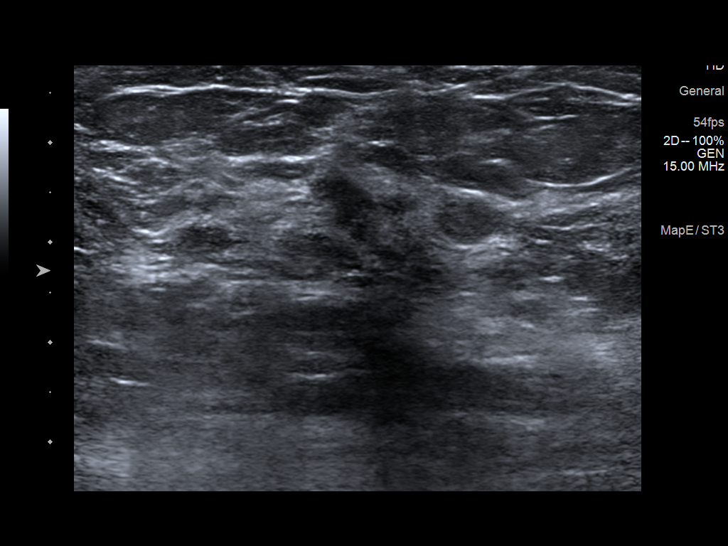
[im 2/9]
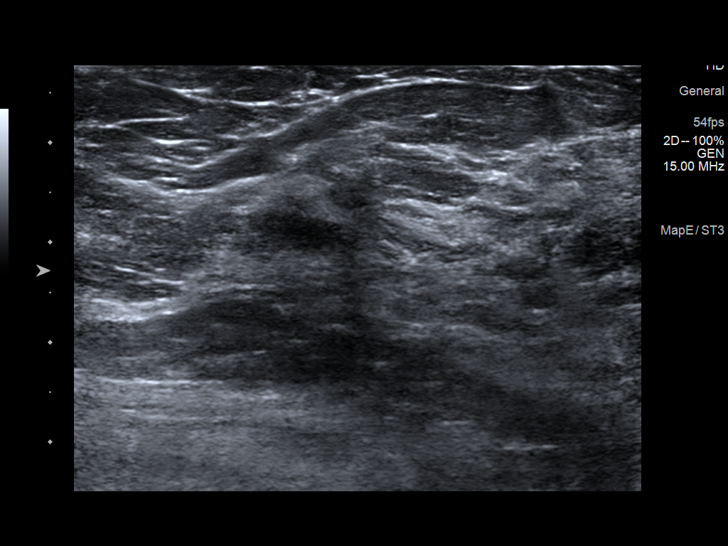
[im 3/9]
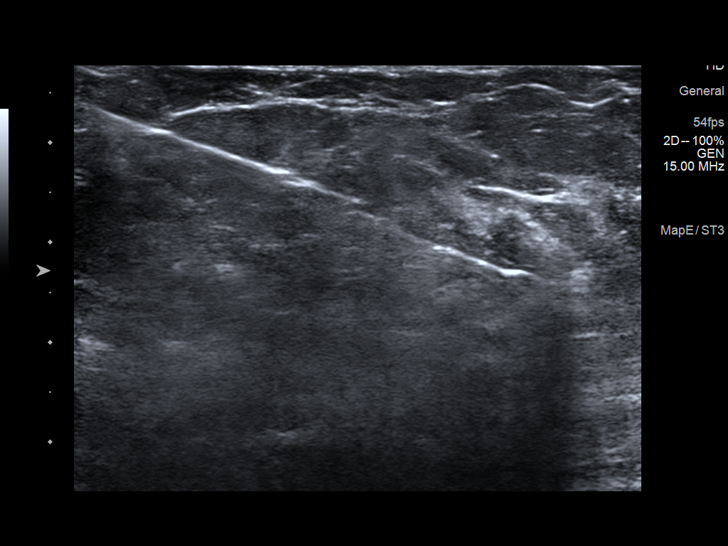
[im 4/9]
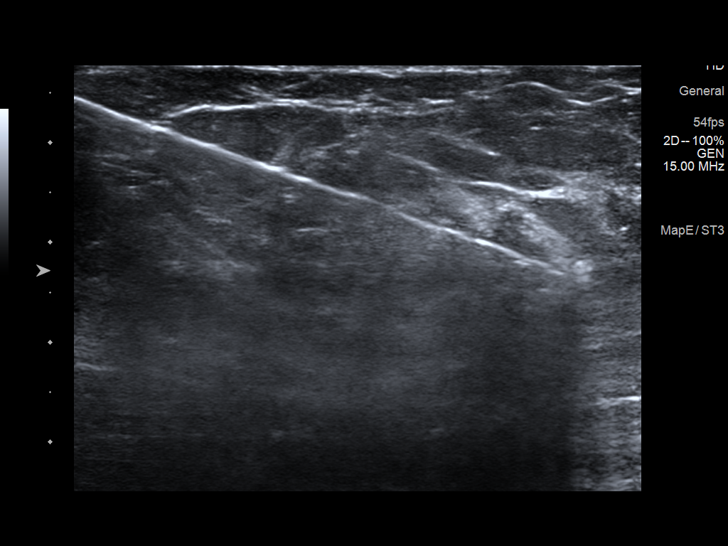
[im 5/9]
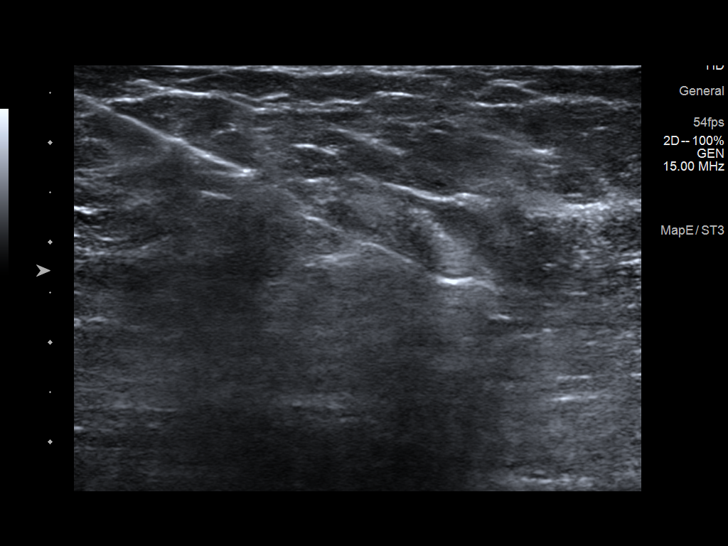
[im 6/9]
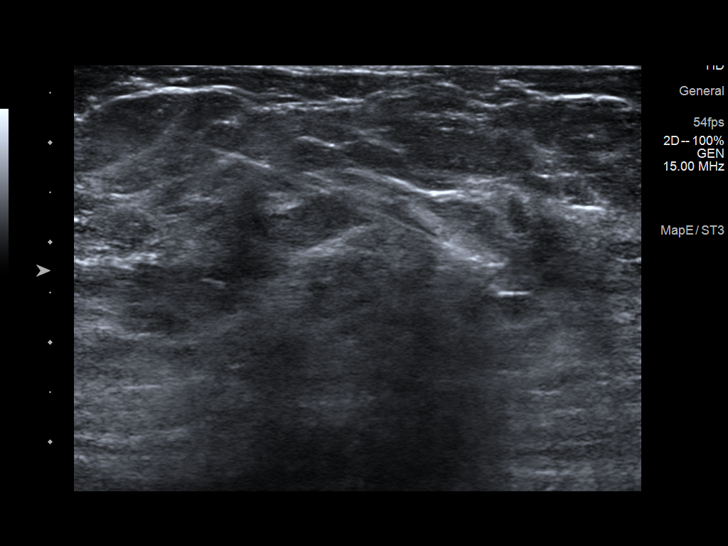
[im 7/9]
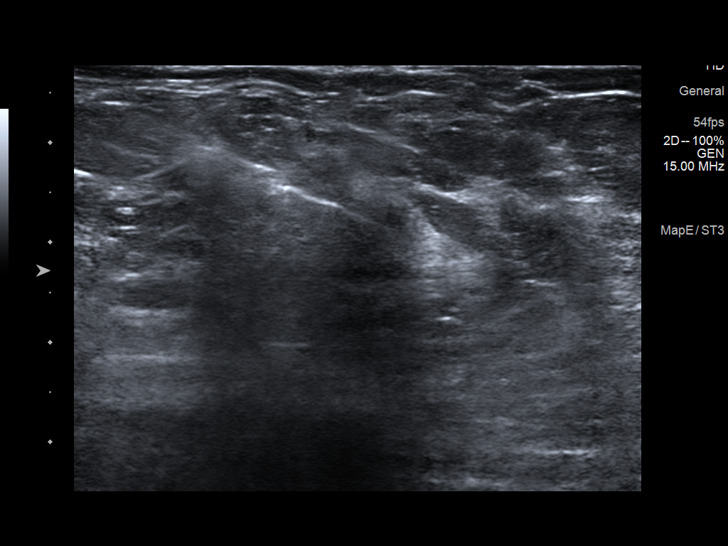
[im 8/9]
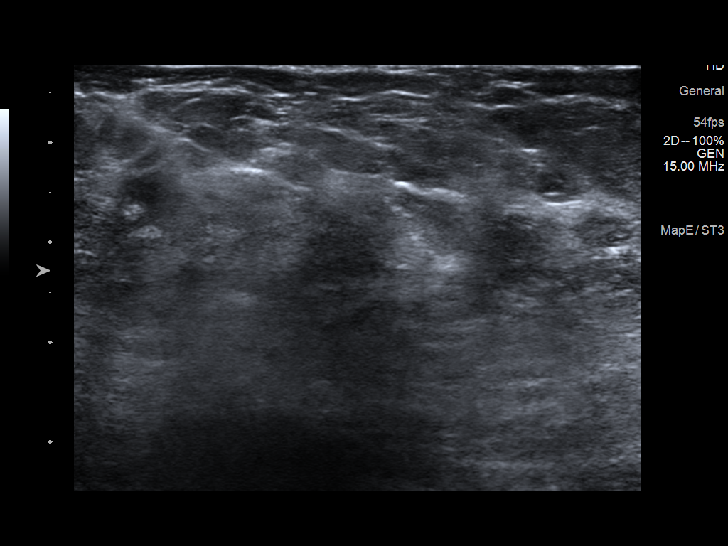
[im 9/9]
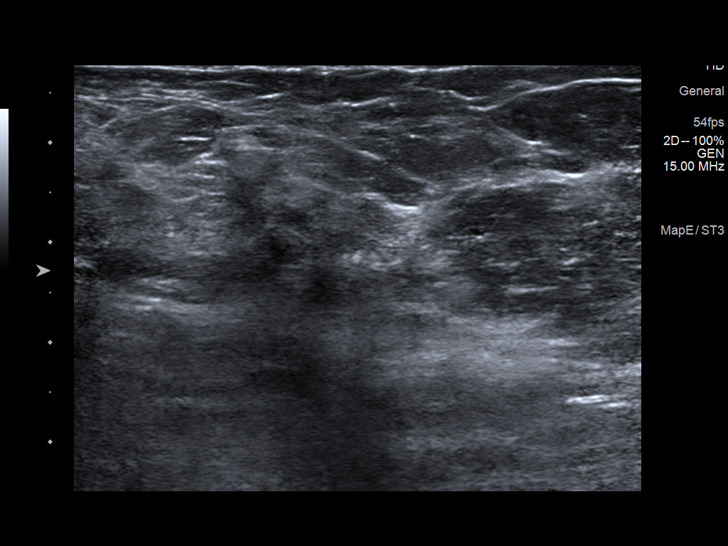

[9 of 9 positions shown; findings below may reference images not displayed]



Lesion quadrant: Upper inner quadrant

At the time of preprocedural scanning, 2 distinct masses were not
well identified along the [DATE] position. A single large shadowing
mass was identified in therefore targeted for biopsy. Using sterile
technique and 1% Lidocaine as local anesthetic, under direct
ultrasound visualization, a 14 gauge Dalil device was used
to perform biopsy of a shadowing mass at the [DATE] position using a
inferior approach. At the conclusion of the procedure a ribbon
shaped tissue marker clip was deployed into the biopsy cavity.
Follow up 2 view mammogram was performed and dictated separately.
IMPRESSION: Ultrasound guided biopsy of the left breast. No apparent
complications.

ADDENDUM:
Pathology revealed DENSE FIBROSIS, CALCIFICATION IN BENIGN GLANDS of
the LEFT breast, 11:30 o'clock 8cmfn, (ribbon clip). This was found
to be concordant by Dr. Chepe Yarbrough.

Pathology results were discussed with the patient by telephone. The
patient reported doing well after the biopsy with minimal tenderness
at the site. Post biopsy instructions and care were reviewed and
questions were answered. The patient was encouraged to call The
direct phone number was provided.

The patient was asked to return for LEFT diagnostic mammography and
possible ultrasound in 6 months and informed a reminder notice would
be sent regarding this appointment.

Pathology results reported by Dzeine Neimanas, RN on 08/11/2021.



Lesion quadrant: Upper inner quadrant

At the time of preprocedural scanning, 2 distinct masses were not
well identified along the [DATE] position. A single large shadowing
mass was identified in therefore targeted for biopsy. Using sterile
technique and 1% Lidocaine as local anesthetic, under direct
ultrasound visualization, a 14 gauge Dalil device was used
to perform biopsy of a shadowing mass at the [DATE] position using a
inferior approach. At the conclusion of the procedure a ribbon
shaped tissue marker clip was deployed into the biopsy cavity.
Follow up 2 view mammogram was performed and dictated separately.
IMPRESSION: Ultrasound guided biopsy of the left breast. No apparent
complications.

## 2022-05-20 IMAGING — US US BREAST*L* LIMITED INC AXILLA
1 series · 13 of 23 positions shown · non-contrast
Comparison: Previous exam(s).

CLINICAL DATA: Screening recall for a possible left breast mass.

EXAM:
DIGITAL DIAGNOSTIC UNILATERAL LEFT MAMMOGRAM WITH TOMOSYNTHESIS AND
CAD; ULTRASOUND LEFT BREAST LIMITED
TECHNIQUE: Left digital diagnostic mammography and breast tomosynthesis was
performed. The images were evaluated with computer-aided detection.;
Targeted ultrasound examination of the left breast was performed.

[Series 1: us breast*left* limited inc axilla · 0.06mm/px · 13 of 23 slices shown]
[im 1/23]
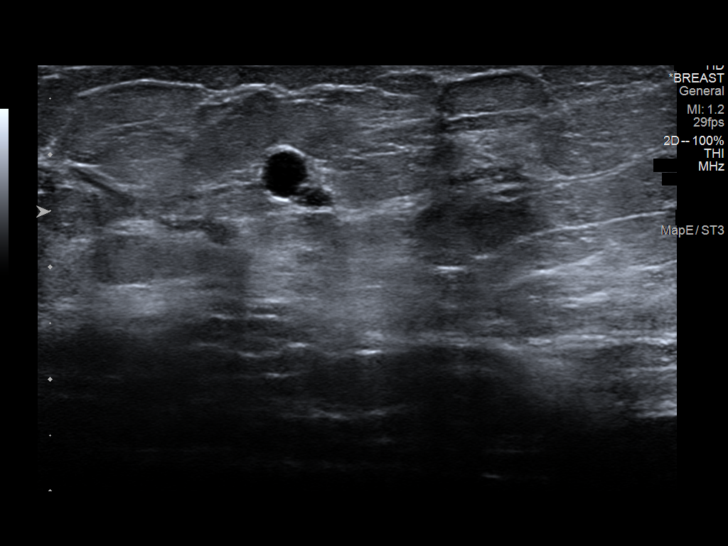
[im 3/23]
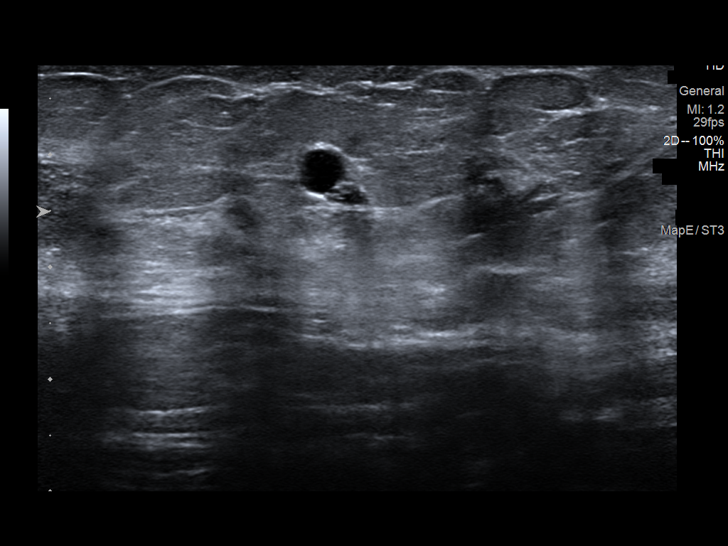
[im 5/23]
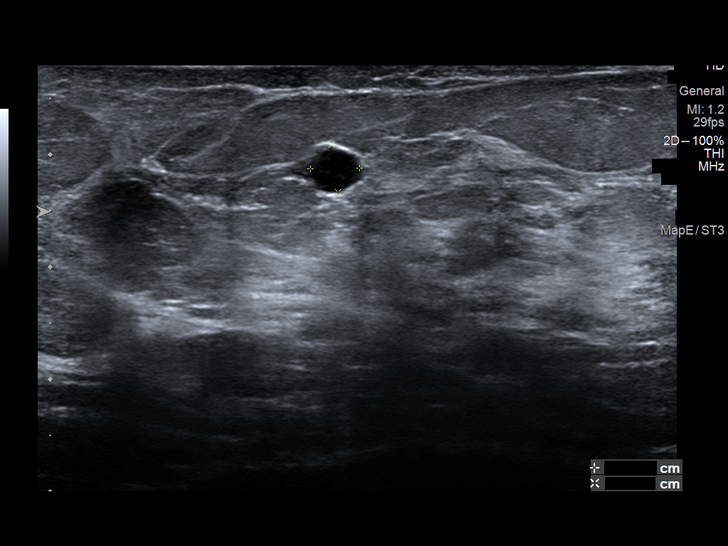
[im 7/23]
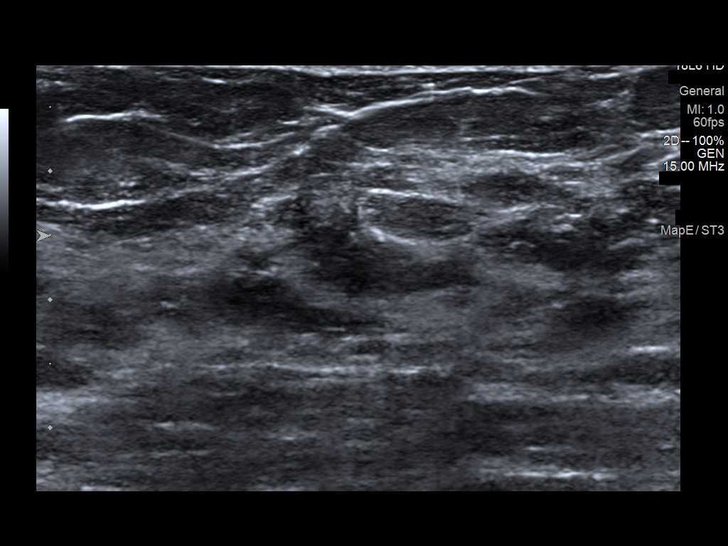
[im 8/23]
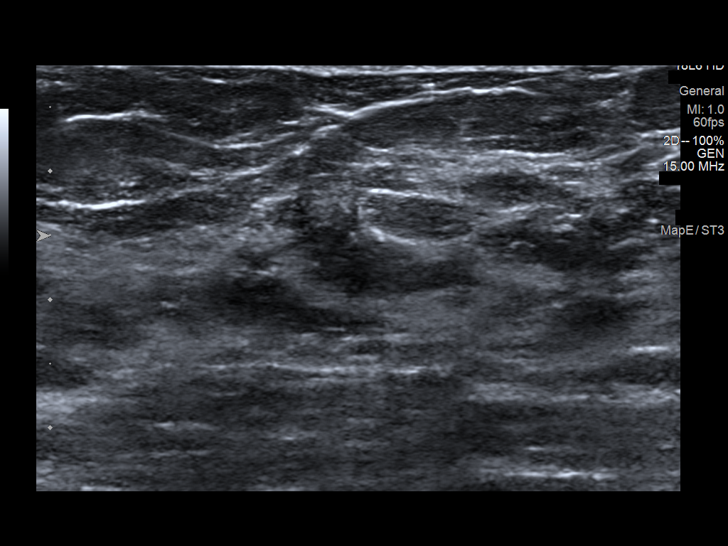
[im 10/23]
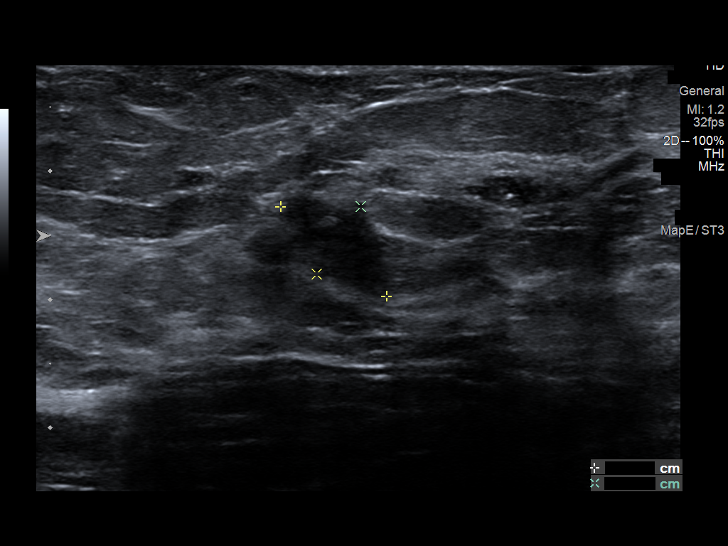
[im 12/23]
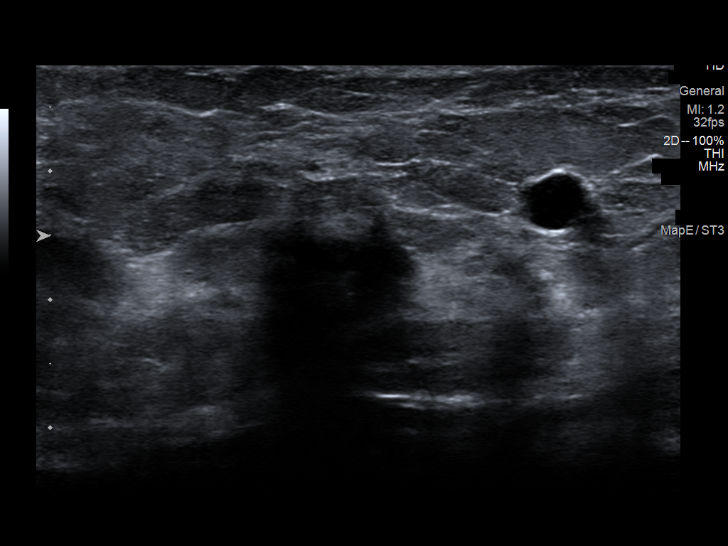
[im 14/23]
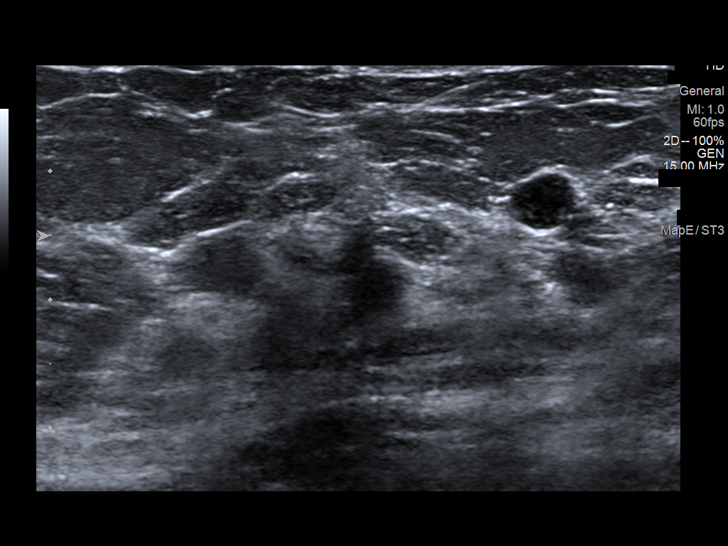
[im 16/23]
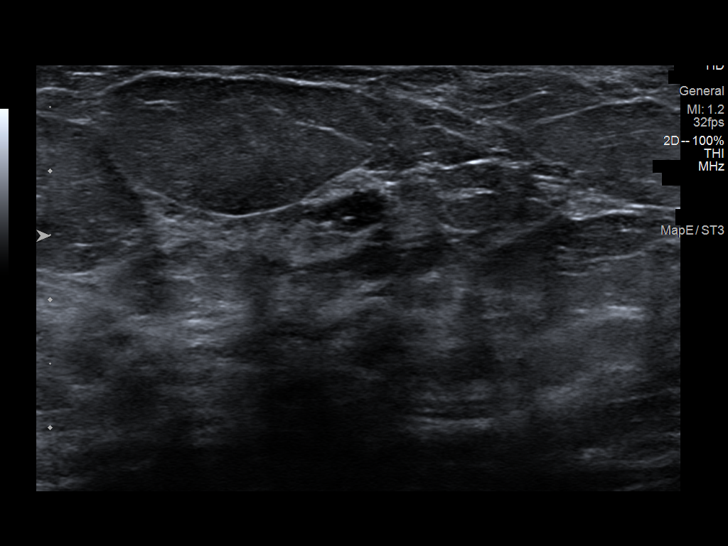
[im 17/23]
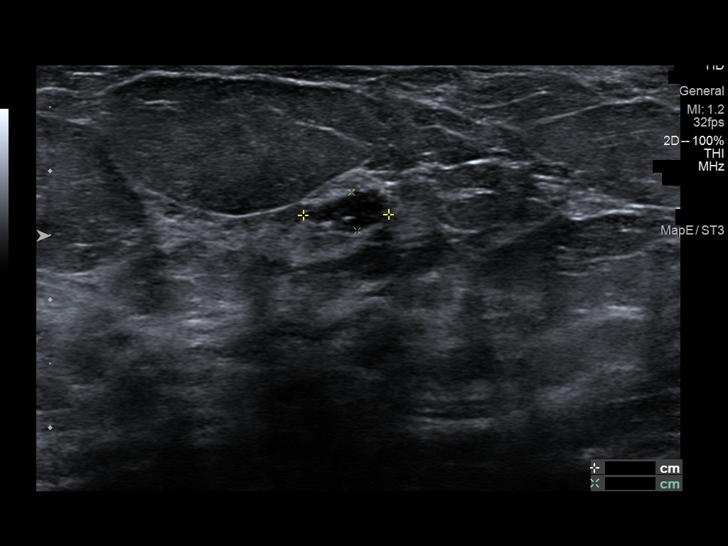
[im 19/23]
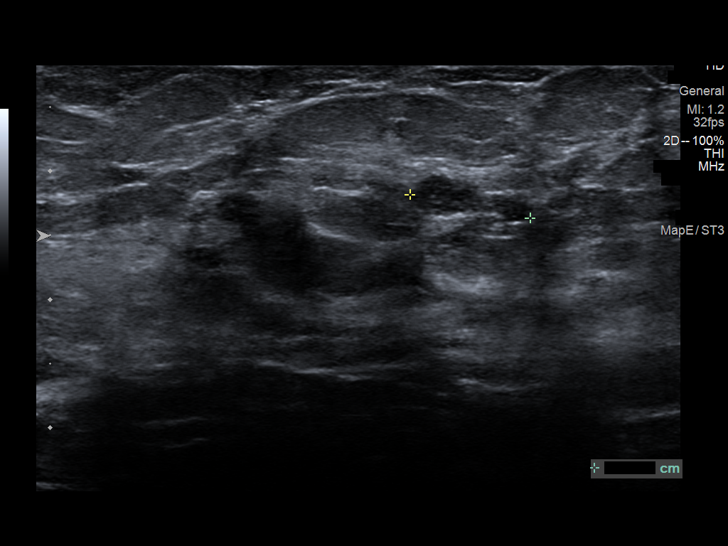
[im 21/23]
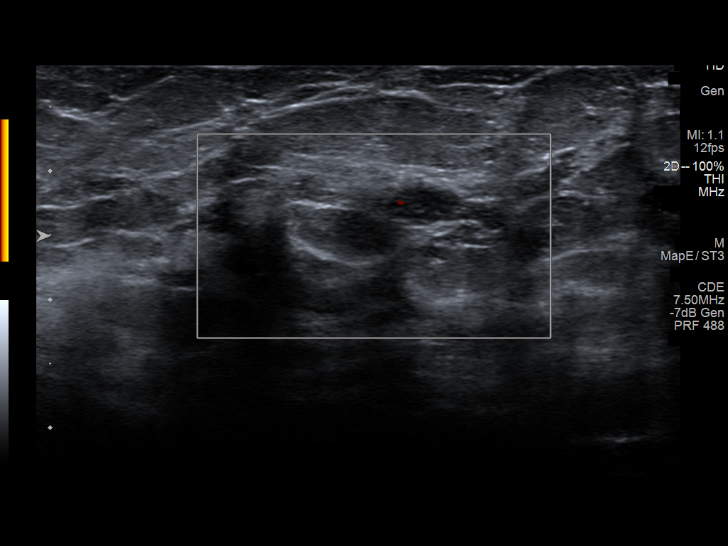
[im 23/23]
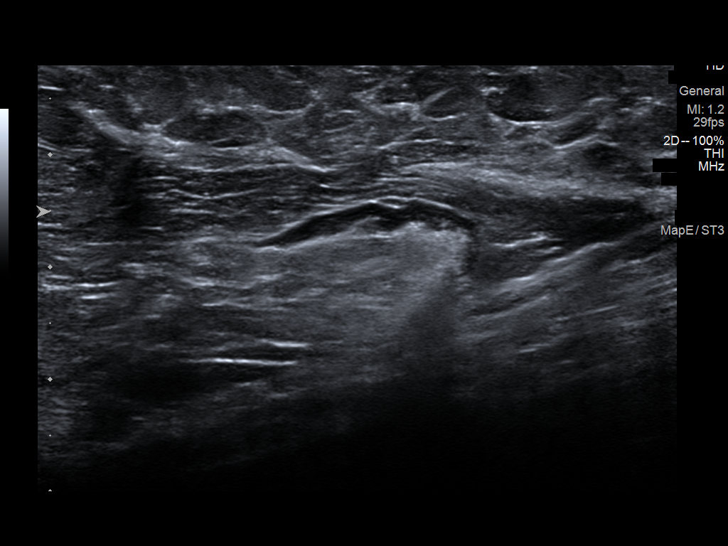

[13 of 23 positions shown; findings below may reference images not displayed]

ACR Breast Density Category b: There are scattered areas of
fibroglandular density.
FINDINGS: Spot compression tomosynthesis images through the medial aspect of
the right breast demonstrates a persistent oval circumscribed mass
measuring approximately 7 mm.

Ultrasound of the left breast at 11 o'clock, 4 cm from the nipple
demonstrates an anechoic round circumscribed mass measuring 4 mm
consistent with a benign cyst. This corresponds in size and location
with the mass identified mammographically.

In the left breast at [DATE], 4 cm from the nipple there is a
shadowing hypoechoic mass measuring 1.2 x 0.6 x 1.1 cm. Adjacent to
this at [DATE], 3 cm from the nipple is a hypoechoic oval mass with
indistinct margins measuring 1.0 x 0.3 x 0.7 cm.

Ultrasound of the left axilla demonstrates multiple normal-appearing
lymph nodes.
IMPRESSION: 1. There are 2 indeterminate adjacent masses in the left breast at

2. The mass in the upper inner left breast on the screening
mammogram corresponds with a benign cyst.

3.  No evidence of left axillary lymphadenopathy.

RECOMMENDATION:
Ultrasound guided biopsy is recommended for the 2 adjacent masses in
the left breast at [DATE]. The procedure will be performed today, and
dictated in a separate report.

I have discussed the findings and recommendations with the patient.
If applicable, a reminder letter will be sent to the patient
regarding the next appointment.

BI-RADS CATEGORY  4: Suspicious.

## 2022-06-29 ENCOUNTER — Encounter: Payer: Self-pay | Admitting: Neurology

## 2022-06-29 ENCOUNTER — Ambulatory Visit: Payer: BC Managed Care – PPO | Admitting: Neurology

## 2022-06-29 ENCOUNTER — Other Ambulatory Visit (INDEPENDENT_AMBULATORY_CARE_PROVIDER_SITE_OTHER): Payer: BC Managed Care – PPO

## 2022-06-29 VITALS — BP 135/83 | HR 78 | Ht 63.0 in | Wt 202.0 lb

## 2022-06-29 DIAGNOSIS — R202 Paresthesia of skin: Secondary | ICD-10-CM

## 2022-06-29 DIAGNOSIS — R208 Other disturbances of skin sensation: Secondary | ICD-10-CM

## 2022-06-29 DIAGNOSIS — G5601 Carpal tunnel syndrome, right upper limb: Secondary | ICD-10-CM

## 2022-06-29 LAB — VITAMIN B12: Vitamin B-12: 1301 pg/mL — ABNORMAL HIGH (ref 211–911)

## 2022-06-29 NOTE — Progress Notes (Signed)
[facility] Neurology Division Clinic Note - Initial Visit   Date: 06/29/2022   Annette Moody MRN: [id number] DOB: 07-18-1956   Dear Dr. Kathi Ludwig:  Thank you for your kind referral of Annette Moody for consultation of paresthesias. Although her history is well known to you, please allow [place] to reiterate it for the purpose of our medical record. The patient was accompanied to the clinic by self.   Annette Moody is a 66 y.o. right-handed female with lupus and hyperlipidemia presenting for evaluation of paresthesias.   IMPRESSION/PLAN: Whole body dysesthesias are unlikely to represent any worrisome neurological condition.  Normal exam is very reassuring.  To be complete, MRI brain wo contrast will be ordered, but my overall suspicion for primary intracranial pathology is very low.  Check vitamin B12.  TSH is normal.   Right hand parethesias are most consistent with carpal tunnel syndrome.  NCS/EMG declined as symptoms are intermittent and mild.  She can start to use a wrist brace as needed.   Further recommendations pending results.   ------------------------------------------------------------- History of present illness: Starting about 5-6 months ago, she began having sensation that her body is humming.  It is constant and she is more aware at night time.  It is worse after she has had a long day.  There are no specific triggers.  She also electrical impulses throughout her body which goes down her thighs and underarms. It occurs about 3 times per night. No associated weakness.   She also complains of numbness involving the right thumb, index, and middle finger which lasts about 10 minutes.  No hand weakness.    She is retired Teacher, early years/pre.  She lives at home with husband in a 2 level home. Nonsmoker.  She does not drink alcohol.   Prior testing shows normal TSH.  Rheumatological evaluation shows positive ANA and anti-Smith antibody.   Past Medical  History:  Diagnosis Date   Anxiety disorder    History of vitamin D deficiency    Hyperlipidemia    Hyperlipidemia    Insomnia    Neuropathy    OSA on CPAP    Vertigo     Past Surgical History:  Procedure Laterality Date   APPENDECTOMY     BREAST BIOPSY Left 08/07/2021   RADIOACTIVE SEED GUIDED EXCISIONAL BREAST BIOPSY Left 04/06/2022   Procedure: RADIOACTIVE SEED GUIDED EXCISIONAL LEFT BREAST BIOPSY;  Surgeon: Emelia Loron, MD;  Location: [facility];  Service: General;  Laterality: Left;     Medications:  Outpatient Encounter Medications as of 06/29/2022  Medication Sig   CALCIUM PO Take 1 tablet by mouth daily.   hydroxychloroquine (PLAQUENIL) 200 MG tablet Take by mouth daily.   meloxicam (MOBIC) 15 MG tablet Take 15 mg by mouth daily.   Misc Natural Products (YUMVS BEET ROOT-TART CHERRY PO) Take by mouth.   Multiple Vitamin (MULTIVITAMIN) capsule Take 1 capsule by mouth daily.   rosuvastatin (CRESTOR) 10 MG tablet Take 1 tablet by mouth daily.   meclizine (ANTIVERT) 25 MG tablet Take 25 mg by mouth 3 (three) times daily as needed for dizziness.   No facility-administered encounter medications on file as of 06/29/2022.    Allergies: No Known Allergies  Family History: Family History  Problem Relation Age of Onset   Diabetes Maternal Grandmother     Social History: Social History   Tobacco Use   Smoking status: Never   Smokeless tobacco: Never  Vaping Use   Vaping Use: Never  used  Substance Use Topics   Alcohol use: Yes    Alcohol/week: 1.0 standard drink of alcohol    Types: 1 Glasses of wine per week    Comment: rarely   Drug use: No   Social History   Social History Narrative   Are you right handed or left handed? right   Are you currently employed ?    What is your current occupation?retired   Do you live at home alone?   Who lives with you? husband   What type of home do you live in: 1 story or 2 story? Two   Caffeine 1 cup  a day        Vital Signs:  BP 135/83   Pulse 78   Ht 5\' 3"  (1.6 m)   Wt 202 lb (91.6 kg)   SpO2 96%   BMI 35.78 kg/m    Neurological Exam: MENTAL STATUS including orientation to time, place, person, recent and remote memory, attention span and concentration, language, and fund of knowledge is normal.  Speech is not dysarthric.  CRANIAL NERVES: II:  No visual field defects.    III-IV-VI: Pupils equal round and reactive to light.  Normal conjugate, extra-ocular eye movements in all directions of gaze.  No nystagmus.  No ptosis.   V:  Normal facial sensation.    VII:  Normal facial symmetry and movements.   VIII:  Normal hearing and vestibular function.   IX-X:  Normal palatal movement.   XI:  Normal shoulder shrug and head rotation.   XII:  Normal tongue strength and range of motion, no deviation or fasciculation.  MOTOR:  No atrophy, fasciculations or abnormal movements.  No pronator drift.   Upper Extremity:  Right  Left  Deltoid  5/5   5/5   Biceps  5/5   5/5   Triceps  5/5   5/5   Wrist extensors  5/5   5/5   Wrist flexors  5/5   5/5   Finger extensors  5/5   5/5   Finger flexors  5/5   5/5   Dorsal interossei  5/5   5/5   Abductor pollicis  5/5   5/5   Tone (Ashworth scale)  0  0   Lower Extremity:  Right  Left  Hip flexors  5/5   5/5   Knee flexors  5/5   5/5   Knee extensors  5/5   5/5   Dorsiflexors  5/5   5/5   Plantarflexors  5/5   5/5   Toe extensors  5/5   5/5   Toe flexors  5/5   5/5   Tone (Ashworth scale)  0  0   MSRs:                                           Right        Left brachioradialis 2+  2+  biceps 2+  2+  triceps 2+  2+  patellar 2+  2+  ankle jerk 2+  2+  Hoffman no  no  plantar response down  down   SENSORY:  Normal and symmetric perception of light touch, pinprick, vibration, and proprioception.  Romberg's sign absent. Negative Tinel's sign at the wrists bilaterally.  COORDINATION/GAIT: Normal finger-to- nose-finger.  Intact  rapid alternating movements bilaterally.  Gait narrow based and stable. Tandem and stressed  gait intact.     Thank you for allowing me to participate in patient's care.  If I can answer any additional questions, I would be pleased to do so.    Sincerely,    Avion Patella K. Allena Katz, DO

## 2022-06-29 NOTE — Patient Instructions (Signed)
MRI brain without contrast  Check vitamin B12  If your right hand tingling gets worse, you can start to wear a wrist brace at night time and/or call my office to schedule nerve testing.

## 2022-07-27 ENCOUNTER — Encounter: Payer: Self-pay | Admitting: Neurology

## 2022-07-28 ENCOUNTER — Ambulatory Visit
Admission: RE | Admit: 2022-07-28 | Discharge: 2022-07-28 | Disposition: A | Discharge status: Home / Self Care | Payer: BC Managed Care – PPO | Source: Ambulatory Visit | Attending: Neurology | Admitting: Neurology

## 2022-07-28 DIAGNOSIS — R208 Other disturbances of skin sensation: Secondary | ICD-10-CM

## 2022-07-28 DIAGNOSIS — R202 Paresthesia of skin: Secondary | ICD-10-CM

## 2022-07-28 DIAGNOSIS — G5601 Carpal tunnel syndrome, right upper limb: Secondary | ICD-10-CM

## 2023-08-04 DIAGNOSIS — E66812 Obesity, class 2: Secondary | ICD-10-CM | POA: Diagnosis not present

## 2023-08-04 DIAGNOSIS — E559 Vitamin D deficiency, unspecified: Secondary | ICD-10-CM | POA: Diagnosis not present

## 2023-08-04 DIAGNOSIS — R7303 Prediabetes: Secondary | ICD-10-CM | POA: Diagnosis not present

## 2023-08-04 DIAGNOSIS — Z6836 Body mass index (BMI) 36.0-36.9, adult: Secondary | ICD-10-CM | POA: Diagnosis not present

## 2023-08-04 DIAGNOSIS — E782 Mixed hyperlipidemia: Secondary | ICD-10-CM | POA: Diagnosis not present

## 2023-08-04 DIAGNOSIS — I7300 Raynaud's syndrome without gangrene: Secondary | ICD-10-CM | POA: Diagnosis not present

## 2023-08-09 DIAGNOSIS — M3210 Systemic lupus erythematosus, organ or system involvement unspecified: Secondary | ICD-10-CM | POA: Diagnosis not present

## 2023-08-09 DIAGNOSIS — Z79899 Other long term (current) drug therapy: Secondary | ICD-10-CM | POA: Diagnosis not present

## 2023-08-16 DIAGNOSIS — E66812 Obesity, class 2: Secondary | ICD-10-CM | POA: Diagnosis not present

## 2023-08-16 DIAGNOSIS — G4733 Obstructive sleep apnea (adult) (pediatric): Secondary | ICD-10-CM | POA: Diagnosis not present

## 2023-08-16 DIAGNOSIS — E782 Mixed hyperlipidemia: Secondary | ICD-10-CM | POA: Diagnosis not present

## 2023-08-16 DIAGNOSIS — R7303 Prediabetes: Secondary | ICD-10-CM | POA: Diagnosis not present

## 2023-08-16 DIAGNOSIS — Z6835 Body mass index (BMI) 35.0-35.9, adult: Secondary | ICD-10-CM | POA: Diagnosis not present

## 2023-08-26 DIAGNOSIS — M25561 Pain in right knee: Secondary | ICD-10-CM | POA: Diagnosis not present

## 2023-08-26 DIAGNOSIS — Z79899 Other long term (current) drug therapy: Secondary | ICD-10-CM | POA: Diagnosis not present

## 2023-08-26 DIAGNOSIS — M359 Systemic involvement of connective tissue, unspecified: Secondary | ICD-10-CM | POA: Diagnosis not present

## 2023-08-26 DIAGNOSIS — R5383 Other fatigue: Secondary | ICD-10-CM | POA: Diagnosis not present

## 2023-08-26 DIAGNOSIS — E785 Hyperlipidemia, unspecified: Secondary | ICD-10-CM | POA: Diagnosis not present

## 2023-08-26 DIAGNOSIS — M1990 Unspecified osteoarthritis, unspecified site: Secondary | ICD-10-CM | POA: Diagnosis not present

## 2023-09-07 DIAGNOSIS — G4733 Obstructive sleep apnea (adult) (pediatric): Secondary | ICD-10-CM | POA: Diagnosis not present

## 2023-09-07 DIAGNOSIS — G4710 Hypersomnia, unspecified: Secondary | ICD-10-CM | POA: Diagnosis not present

## 2023-09-07 DIAGNOSIS — E66812 Obesity, class 2: Secondary | ICD-10-CM | POA: Diagnosis not present

## 2023-09-07 DIAGNOSIS — Z6836 Body mass index (BMI) 36.0-36.9, adult: Secondary | ICD-10-CM | POA: Diagnosis not present

## 2023-10-13 DIAGNOSIS — G4733 Obstructive sleep apnea (adult) (pediatric): Secondary | ICD-10-CM | POA: Diagnosis not present

## 2023-10-22 DIAGNOSIS — Z6835 Body mass index (BMI) 35.0-35.9, adult: Secondary | ICD-10-CM | POA: Diagnosis not present

## 2023-10-22 DIAGNOSIS — Z124 Encounter for screening for malignant neoplasm of cervix: Secondary | ICD-10-CM | POA: Diagnosis not present

## 2023-10-22 DIAGNOSIS — M8588 Other specified disorders of bone density and structure, other site: Secondary | ICD-10-CM | POA: Diagnosis not present

## 2023-10-22 DIAGNOSIS — Z0000 Encounter for general adult medical examination without abnormal findings: Secondary | ICD-10-CM | POA: Diagnosis not present

## 2023-10-22 DIAGNOSIS — Z1211 Encounter for screening for malignant neoplasm of colon: Secondary | ICD-10-CM | POA: Diagnosis not present

## 2023-10-22 DIAGNOSIS — Z1231 Encounter for screening mammogram for malignant neoplasm of breast: Secondary | ICD-10-CM | POA: Diagnosis not present

## 2023-10-25 ENCOUNTER — Other Ambulatory Visit: Payer: Self-pay | Admitting: Family Medicine

## 2023-10-25 DIAGNOSIS — Z1231 Encounter for screening mammogram for malignant neoplasm of breast: Secondary | ICD-10-CM

## 2023-10-26 ENCOUNTER — Other Ambulatory Visit: Payer: Self-pay | Admitting: Family Medicine

## 2023-10-26 DIAGNOSIS — Z780 Asymptomatic menopausal state: Secondary | ICD-10-CM

## 2023-10-27 DIAGNOSIS — D122 Benign neoplasm of ascending colon: Secondary | ICD-10-CM | POA: Diagnosis not present

## 2023-10-27 DIAGNOSIS — Z860100 Personal history of colon polyps, unspecified: Secondary | ICD-10-CM | POA: Diagnosis not present

## 2023-10-27 DIAGNOSIS — K5730 Diverticulosis of large intestine without perforation or abscess without bleeding: Secondary | ICD-10-CM | POA: Diagnosis not present

## 2023-10-27 DIAGNOSIS — Z09 Encounter for follow-up examination after completed treatment for conditions other than malignant neoplasm: Secondary | ICD-10-CM | POA: Diagnosis not present

## 2023-10-29 DIAGNOSIS — D122 Benign neoplasm of ascending colon: Secondary | ICD-10-CM | POA: Diagnosis not present

## 2023-11-01 DIAGNOSIS — I10 Essential (primary) hypertension: Secondary | ICD-10-CM | POA: Diagnosis not present

## 2023-11-01 DIAGNOSIS — G4733 Obstructive sleep apnea (adult) (pediatric): Secondary | ICD-10-CM | POA: Diagnosis not present

## 2023-11-01 DIAGNOSIS — M797 Fibromyalgia: Secondary | ICD-10-CM | POA: Diagnosis not present

## 2023-11-01 DIAGNOSIS — E66811 Obesity, class 1: Secondary | ICD-10-CM | POA: Diagnosis not present

## 2023-11-01 DIAGNOSIS — Z6833 Body mass index (BMI) 33.0-33.9, adult: Secondary | ICD-10-CM | POA: Diagnosis not present

## 2023-11-01 DIAGNOSIS — I7300 Raynaud's syndrome without gangrene: Secondary | ICD-10-CM | POA: Diagnosis not present

## 2023-11-03 DIAGNOSIS — I7300 Raynaud's syndrome without gangrene: Secondary | ICD-10-CM | POA: Diagnosis not present

## 2023-11-11 ENCOUNTER — Ambulatory Visit
Admission: RE | Admit: 2023-11-11 | Discharge: 2023-11-11 | Discharge status: Home / Self Care | Payer: Self-pay | Source: Ambulatory Visit | Attending: Family Medicine | Admitting: Family Medicine

## 2023-11-11 DIAGNOSIS — Z1231 Encounter for screening mammogram for malignant neoplasm of breast: Secondary | ICD-10-CM | POA: Diagnosis not present

## 2023-11-16 DIAGNOSIS — G4733 Obstructive sleep apnea (adult) (pediatric): Secondary | ICD-10-CM | POA: Diagnosis not present

## 2023-11-16 DIAGNOSIS — M1990 Unspecified osteoarthritis, unspecified site: Secondary | ICD-10-CM | POA: Diagnosis not present

## 2023-11-16 DIAGNOSIS — F419 Anxiety disorder, unspecified: Secondary | ICD-10-CM | POA: Diagnosis not present

## 2023-11-16 DIAGNOSIS — M8580 Other specified disorders of bone density and structure, unspecified site: Secondary | ICD-10-CM | POA: Diagnosis not present

## 2023-11-16 DIAGNOSIS — M329 Systemic lupus erythematosus, unspecified: Secondary | ICD-10-CM | POA: Diagnosis not present

## 2023-11-16 DIAGNOSIS — E669 Obesity, unspecified: Secondary | ICD-10-CM | POA: Diagnosis not present

## 2023-11-16 DIAGNOSIS — R7303 Prediabetes: Secondary | ICD-10-CM | POA: Diagnosis not present

## 2023-11-16 DIAGNOSIS — Z833 Family history of diabetes mellitus: Secondary | ICD-10-CM | POA: Diagnosis not present

## 2023-11-16 DIAGNOSIS — E785 Hyperlipidemia, unspecified: Secondary | ICD-10-CM | POA: Diagnosis not present

## 2023-12-17 DIAGNOSIS — E66811 Obesity, class 1: Secondary | ICD-10-CM | POA: Diagnosis not present

## 2023-12-17 DIAGNOSIS — G4733 Obstructive sleep apnea (adult) (pediatric): Secondary | ICD-10-CM | POA: Diagnosis not present

## 2023-12-17 DIAGNOSIS — Z6833 Body mass index (BMI) 33.0-33.9, adult: Secondary | ICD-10-CM | POA: Diagnosis not present

## 2023-12-17 DIAGNOSIS — E782 Mixed hyperlipidemia: Secondary | ICD-10-CM | POA: Diagnosis not present

## 2023-12-17 DIAGNOSIS — R7303 Prediabetes: Secondary | ICD-10-CM | POA: Diagnosis not present

## 2023-12-23 DIAGNOSIS — M359 Systemic involvement of connective tissue, unspecified: Secondary | ICD-10-CM | POA: Diagnosis not present

## 2023-12-23 DIAGNOSIS — M1990 Unspecified osteoarthritis, unspecified site: Secondary | ICD-10-CM | POA: Diagnosis not present

## 2023-12-23 DIAGNOSIS — Z79899 Other long term (current) drug therapy: Secondary | ICD-10-CM | POA: Diagnosis not present

## 2023-12-23 DIAGNOSIS — E785 Hyperlipidemia, unspecified: Secondary | ICD-10-CM | POA: Diagnosis not present

## 2023-12-23 DIAGNOSIS — R5383 Other fatigue: Secondary | ICD-10-CM | POA: Diagnosis not present

## 2023-12-23 DIAGNOSIS — M25561 Pain in right knee: Secondary | ICD-10-CM | POA: Diagnosis not present

## 2024-01-19 DIAGNOSIS — R7303 Prediabetes: Secondary | ICD-10-CM | POA: Diagnosis not present

## 2024-01-19 DIAGNOSIS — G4733 Obstructive sleep apnea (adult) (pediatric): Secondary | ICD-10-CM | POA: Diagnosis not present

## 2024-01-19 DIAGNOSIS — E782 Mixed hyperlipidemia: Secondary | ICD-10-CM | POA: Diagnosis not present

## 2024-01-19 DIAGNOSIS — E66811 Obesity, class 1: Secondary | ICD-10-CM | POA: Diagnosis not present

## 2024-01-19 DIAGNOSIS — E6609 Other obesity due to excess calories: Secondary | ICD-10-CM | POA: Diagnosis not present

## 2024-01-19 DIAGNOSIS — Z6832 Body mass index (BMI) 32.0-32.9, adult: Secondary | ICD-10-CM | POA: Diagnosis not present

## 2024-01-31 DIAGNOSIS — M25512 Pain in left shoulder: Secondary | ICD-10-CM | POA: Diagnosis not present

## 2024-02-16 DIAGNOSIS — Z6832 Body mass index (BMI) 32.0-32.9, adult: Secondary | ICD-10-CM | POA: Diagnosis not present

## 2024-02-16 DIAGNOSIS — E66811 Obesity, class 1: Secondary | ICD-10-CM | POA: Diagnosis not present

## 2024-02-16 DIAGNOSIS — E6609 Other obesity due to excess calories: Secondary | ICD-10-CM | POA: Diagnosis not present

## 2024-02-16 DIAGNOSIS — G4733 Obstructive sleep apnea (adult) (pediatric): Secondary | ICD-10-CM | POA: Diagnosis not present

## 2024-02-16 DIAGNOSIS — E782 Mixed hyperlipidemia: Secondary | ICD-10-CM | POA: Diagnosis not present

## 2024-02-16 DIAGNOSIS — R7303 Prediabetes: Secondary | ICD-10-CM | POA: Diagnosis not present

## 2024-04-05 ENCOUNTER — Other Ambulatory Visit (HOSPITAL_BASED_OUTPATIENT_CLINIC_OR_DEPARTMENT_OTHER): Payer: Self-pay

## 2024-04-05 ENCOUNTER — Ambulatory Visit (HOSPITAL_BASED_OUTPATIENT_CLINIC_OR_DEPARTMENT_OTHER)
Admission: RE | Admit: 2024-04-05 | Discharge: 2024-04-05 | Disposition: A | Discharge status: Home / Self Care | Source: Ambulatory Visit | Attending: Family Medicine | Admitting: Family Medicine

## 2024-04-05 DIAGNOSIS — Z780 Asymptomatic menopausal state: Secondary | ICD-10-CM | POA: Insufficient documentation

## 2024-04-20 DIAGNOSIS — G4733 Obstructive sleep apnea (adult) (pediatric): Secondary | ICD-10-CM | POA: Diagnosis not present

## 2024-05-04 DIAGNOSIS — L649 Androgenic alopecia, unspecified: Secondary | ICD-10-CM | POA: Diagnosis not present

## 2024-05-15 ENCOUNTER — Other Ambulatory Visit (HOSPITAL_BASED_OUTPATIENT_CLINIC_OR_DEPARTMENT_OTHER)

## 2024-05-16 DIAGNOSIS — G4733 Obstructive sleep apnea (adult) (pediatric): Secondary | ICD-10-CM | POA: Diagnosis not present

## 2024-05-16 DIAGNOSIS — R7303 Prediabetes: Secondary | ICD-10-CM | POA: Diagnosis not present

## 2024-05-16 DIAGNOSIS — E66811 Obesity, class 1: Secondary | ICD-10-CM | POA: Diagnosis not present

## 2024-05-16 DIAGNOSIS — E782 Mixed hyperlipidemia: Secondary | ICD-10-CM | POA: Diagnosis not present

## 2024-05-16 DIAGNOSIS — E559 Vitamin D deficiency, unspecified: Secondary | ICD-10-CM | POA: Diagnosis not present

## 2024-05-29 DIAGNOSIS — M19012 Primary osteoarthritis, left shoulder: Secondary | ICD-10-CM | POA: Diagnosis not present

## 2024-06-20 DIAGNOSIS — M25812 Other specified joint disorders, left shoulder: Secondary | ICD-10-CM | POA: Diagnosis not present

## 2024-06-20 DIAGNOSIS — G8918 Other acute postprocedural pain: Secondary | ICD-10-CM | POA: Diagnosis not present

## 2024-06-20 DIAGNOSIS — M19012 Primary osteoarthritis, left shoulder: Secondary | ICD-10-CM | POA: Diagnosis not present

## 2024-06-20 DIAGNOSIS — M75112 Incomplete rotator cuff tear or rupture of left shoulder, not specified as traumatic: Secondary | ICD-10-CM | POA: Diagnosis not present

## 2024-06-21 DIAGNOSIS — M1990 Unspecified osteoarthritis, unspecified site: Secondary | ICD-10-CM | POA: Diagnosis not present

## 2024-06-21 DIAGNOSIS — M359 Systemic involvement of connective tissue, unspecified: Secondary | ICD-10-CM | POA: Diagnosis not present

## 2024-06-21 DIAGNOSIS — Z79899 Other long term (current) drug therapy: Secondary | ICD-10-CM | POA: Diagnosis not present

## 2024-06-30 ENCOUNTER — Other Ambulatory Visit: Payer: Self-pay

## 2024-07-10 ENCOUNTER — Other Ambulatory Visit (HOSPITAL_BASED_OUTPATIENT_CLINIC_OR_DEPARTMENT_OTHER): Payer: Self-pay

## 2024-08-21 ENCOUNTER — Telehealth: Payer: Self-pay
